# Patient Record
Sex: Female | Born: 1955
Health system: Southern US, Community
[De-identification: ages and names within clinical notes are randomized; demographics above are authoritative.]

## PROBLEM LIST (undated history)

## (undated) DIAGNOSIS — K602 Anal fissure, unspecified: Secondary | ICD-10-CM

## (undated) DIAGNOSIS — E119 Type 2 diabetes mellitus without complications: Secondary | ICD-10-CM

## (undated) DIAGNOSIS — K635 Polyp of colon: Secondary | ICD-10-CM

## (undated) DIAGNOSIS — E785 Hyperlipidemia, unspecified: Secondary | ICD-10-CM

## (undated) DIAGNOSIS — I10 Essential (primary) hypertension: Secondary | ICD-10-CM

## (undated) HISTORY — DX: Hyperlipidemia, unspecified: E78.5

## (undated) HISTORY — DX: Type 2 diabetes mellitus without complications: E11.9

## (undated) HISTORY — DX: Anal fissure, unspecified: K60.2

## (undated) HISTORY — PX: TONSILLECTOMY: SUR1361

## (undated) HISTORY — DX: Polyp of colon: K63.5

---

## 1979-02-22 HISTORY — PX: PELVIC LAPAROSCOPY: SHX162

## 1999-09-05 ENCOUNTER — Encounter: Admission: RE | Admit: 1999-09-05 | Discharge: 1999-09-05 | Payer: Self-pay | Admitting: Orthopedic Surgery

## 1999-09-05 ENCOUNTER — Encounter: Payer: Self-pay | Admitting: Orthopedic Surgery

## 1999-09-12 ENCOUNTER — Other Ambulatory Visit: Admission: RE | Admit: 1999-09-12 | Discharge: 1999-09-12 | Payer: Self-pay | Admitting: Obstetrics and Gynecology

## 2000-11-27 ENCOUNTER — Other Ambulatory Visit: Admission: RE | Admit: 2000-11-27 | Discharge: 2000-11-27 | Payer: Self-pay | Admitting: Obstetrics and Gynecology

## 2001-08-23 ENCOUNTER — Other Ambulatory Visit: Admission: RE | Admit: 2001-08-23 | Discharge: 2001-08-23 | Payer: Self-pay | Admitting: Obstetrics and Gynecology

## 2001-12-02 ENCOUNTER — Encounter: Payer: Self-pay | Admitting: Family Medicine

## 2001-12-02 ENCOUNTER — Encounter: Admission: RE | Admit: 2001-12-02 | Discharge: 2001-12-02 | Payer: Self-pay | Admitting: Family Medicine

## 2002-09-23 ENCOUNTER — Other Ambulatory Visit: Admission: RE | Admit: 2002-09-23 | Discharge: 2002-09-23 | Payer: Self-pay | Admitting: Obstetrics and Gynecology

## 2005-03-03 ENCOUNTER — Other Ambulatory Visit: Admission: RE | Admit: 2005-03-03 | Discharge: 2005-03-03 | Payer: Self-pay | Admitting: Obstetrics and Gynecology

## 2006-05-16 ENCOUNTER — Other Ambulatory Visit: Admission: RE | Admit: 2006-05-16 | Discharge: 2006-05-16 | Payer: Self-pay | Admitting: Obstetrics & Gynecology

## 2006-12-27 LAB — HM DEXA SCAN

## 2008-04-27 LAB — HM MAMMOGRAPHY: HM Mammogram: NEGATIVE

## 2008-05-11 ENCOUNTER — Other Ambulatory Visit: Admission: RE | Admit: 2008-05-11 | Discharge: 2008-05-11 | Payer: Self-pay | Admitting: Obstetrics and Gynecology

## 2010-04-25 LAB — HM COLONOSCOPY

## 2010-10-20 HISTORY — PX: ANAL FISSURE REPAIR: SHX2312

## 2011-10-24 LAB — HM PAP SMEAR: HM Pap smear: NEGATIVE

## 2012-10-23 ENCOUNTER — Other Ambulatory Visit: Payer: Self-pay | Admitting: Family

## 2012-10-23 ENCOUNTER — Ambulatory Visit
Admission: RE | Admit: 2012-10-23 | Discharge: 2012-10-23 | Disposition: A | Discharge status: Home / Self Care | Payer: PRIVATE HEALTH INSURANCE | Source: Ambulatory Visit | Attending: Family | Admitting: Family

## 2013-03-20 ENCOUNTER — Telehealth: Payer: Self-pay | Admitting: Nurse Practitioner

## 2013-03-20 NOTE — Telephone Encounter (Signed)
Patient calling she isn't sure when she should have her next annual since she is in menopause. Last exam was in October 24 2011 according to [facility].

## 2013-03-20 NOTE — Telephone Encounter (Signed)
LMTCB 03/20/13 cm

## 2013-03-21 NOTE — Telephone Encounter (Signed)
Booked AEX

## 2013-04-18 ENCOUNTER — Encounter: Payer: Self-pay | Admitting: Nurse Practitioner

## 2013-04-21 ENCOUNTER — Encounter: Payer: Self-pay | Admitting: Nurse Practitioner

## 2013-04-21 ENCOUNTER — Ambulatory Visit (INDEPENDENT_AMBULATORY_CARE_PROVIDER_SITE_OTHER): Payer: PRIVATE HEALTH INSURANCE | Admitting: Nurse Practitioner

## 2013-04-21 VITALS — BP 120/76 | HR 72 | Resp 16 | Ht 59.5 in | Wt 136.0 lb

## 2013-04-21 DIAGNOSIS — E559 Vitamin D deficiency, unspecified: Secondary | ICD-10-CM

## 2013-04-21 DIAGNOSIS — Z0000 Encounter for general adult medical examination without abnormal findings: Secondary | ICD-10-CM

## 2013-04-21 DIAGNOSIS — Z01419 Encounter for gynecological examination (general) (routine) without abnormal findings: Secondary | ICD-10-CM

## 2013-04-21 LAB — POCT URINALYSIS DIPSTICK
Bilirubin, UA: NEGATIVE
Blood, UA: NEGATIVE
Glucose, UA: NEGATIVE
Ketones, UA: NEGATIVE
Leukocytes, UA: NEGATIVE

## 2013-04-21 NOTE — Progress Notes (Signed)
Patient ID: Stephanie Stokes, female   DOB: 07-22-1955, 57 y.o.   MRN: [id number] 57 y.o. G2P2002 Married African American Fe here for annual exam.  Since daughter, Stephanie Stokes's suicide last year they have finally gotten rid of the car and her belongings. This is a new reality of her death.  Patient is still in counseling trying to deal with loss.  No LMP recorded. Patient is postmenopausal.          Sexually active: yes  The current method of family planning is post menopausal status.    Exercising: yes  Home exercise routine includes stretching and cardio at least 5 days per week. Smoker:  no  Health Maintenance: Pap: 10/24/11, WNL, neg HR HPV MMG: 04/27/08, Bi-Rads 1: negative Colonoscopy: 04/25/10, hyperplastic polyp, repeat 5 years BMD: 12/27/06, 2.9/0.9/0.5/1.3 TDaP: ?? Labs: PCP   reports that she has never smoked. She has never used smokeless tobacco. She reports that she does not drink alcohol or use illicit drugs.  Past Medical History  Diagnosis Date  . Anal fissure     Dr. Loreta Ave  . Colon polyp   . Diabetes mellitus without complication     pre-diabetes    Past Surgical History  Procedure Laterality Date  . Anal fissure repair  10/20/10    Dr. Loreta Ave    Current Outpatient Prescriptions  Medication Sig Dispense Refill  . ALPHA LIPOIC ACID PO Take by mouth.      Marland Kitchen aspirin EC 81 MG tablet Take 81 mg by mouth daily.      . citalopram (CELEXA) 20 MG tablet Take 20 mg by mouth daily.      . diazepam (VALIUM) 5 MG tablet Take 5 mg by mouth every 6 (six) hours as needed for anxiety.       No current facility-administered medications for this visit.    History reviewed. No pertinent family history.  ROS:  Pertinent items are noted in HPI.  Otherwise, a comprehensive ROS was negative.  Exam:   BP 120/76  Pulse 72  Resp 16  Ht 4' 11.5" (1.511 m)  Wt 136 lb (61.689 kg)  BMI 27.02 kg/m2 Height: 4' 11.5" (151.1 cm)  Ht Readings from Last 3 Encounters:  04/21/13 4' 11.5" (1.511  m)    General appearance: alert, cooperative and appears stated age Head: Normocephalic, without obvious abnormality, atraumatic Neck: no adenopathy, supple, symmetrical, trachea midline and thyroid normal to inspection and palpation Lungs: clear to auscultation bilaterally Breasts: normal appearance, no masses or tenderness Heart: regular rate and rhythm Abdomen: soft, non-tender; no masses,  no organomegaly Extremities: extremities normal, atraumatic, no cyanosis or edema Skin: Skin color, texture, turgor normal. No rashes or lesions Lymph nodes: Cervical, supraclavicular, and axillary nodes normal. No abnormal inguinal nodes palpated Neurologic: Grossly normal   Pelvic: External genitalia:  no lesions              Urethra:  normal appearing urethra with no masses, tenderness or lesions              Bartholin's and Skene's: normal                 Vagina: normal appearing vagina with normal color and discharge, no lesions              Cervix: anteverted              Pap taken: no Bimanual Exam:  Uterus:  normal size, contour, position, consistency, mobility, non-tender  Adnexa: no mass, fullness, tenderness               Rectovaginal: Confirms               Anus:  normal sphincter tone, no lesions  A:  Well Woman with normal exam  Postmenopausal  Grief reaction to daughters death  P:   Pap smear as per guidelines Not done this year  Mammogram due now and patient will call and schedule ( thinks it was done early last year)  Check Vit D and follow  Counseled on breast self exam, mammography screening, adequate intake of calcium and vitamin D, diet and exercise return annually or prn  An After Visit Summary was printed and given to the patient.

## 2013-04-21 NOTE — Patient Instructions (Signed)

## 2013-04-23 ENCOUNTER — Telehealth: Payer: Self-pay | Admitting: Nurse Practitioner

## 2013-04-23 NOTE — Telephone Encounter (Signed)
Notified of results

## 2013-04-23 NOTE — Telephone Encounter (Signed)
Chief Complaint  Patient presents with   Results  pt calling for vit d results.

## 2013-04-24 NOTE — Progress Notes (Signed)
Encounter reviewed by Dr. Brook Silva.  

## 2013-07-14 ENCOUNTER — Telehealth: Payer: Self-pay | Admitting: Nurse Practitioner

## 2013-07-14 NOTE — Telephone Encounter (Signed)
Stephanie Stokes from [facility] requests records with last vitamin D be faxed to their office please. Record faxed as requested.

## 2013-11-13 ENCOUNTER — Telehealth: Payer: Self-pay | Admitting: Nurse Practitioner

## 2013-11-13 NOTE — Telephone Encounter (Signed)
Spoke with patient. Patient states that she has been experiencing more vaginal dryness than ever before and would like to use something OTC for treatment. Advised patient could try Extra Virgin Olive Oil or Coconut oil. She can apply a small amount to vaginal area as needed for relief of vaginal dryness. Patient agreeable and states she will try this and give us a call back if she does not have relief and would like prescription.   Routing to provider for final review. Patient agreeable to disposition. Will close encounter.

## 2013-11-13 NOTE — Telephone Encounter (Signed)
Agree with recommendation

## 2013-11-13 NOTE — Telephone Encounter (Signed)
Pt wants to talk with the nurse no info given °

## 2014-05-04 ENCOUNTER — Encounter: Payer: Self-pay | Admitting: Nurse Practitioner

## 2014-05-18 ENCOUNTER — Telehealth: Payer: Self-pay | Admitting: Nurse Practitioner

## 2014-05-18 NOTE — Telephone Encounter (Signed)
Spoke with patient states at last visit PG discussed a herbal supplement that will help with sleep. Please advise. She has been on citalopram 20 mg daily and valium 5mg  1/2 tablet in am and 1/2 tablet in the pm. States is going to ask PCP if she can titrate off the citalopram after the first of the year and has not used any valium since 05/15/14, had titrated to just PRN.

## 2014-05-18 NOTE — Telephone Encounter (Signed)
Melatonin OTC will help her sleep cycles return to normal.

## 2014-05-18 NOTE — Telephone Encounter (Signed)
Patient calling to request name of over the counter medicine to "help with sleep during menopause."

## 2014-05-19 NOTE — Telephone Encounter (Signed)
Patient notified. Aware to call prn.//kn

## 2014-09-22 ENCOUNTER — Telehealth: Payer: Self-pay | Admitting: Nurse Practitioner

## 2014-09-22 NOTE — Telephone Encounter (Signed)
Patient had called at lunch and left a message with no details. I am returning her call to see how we can be of service today.

## 2014-09-22 NOTE — Telephone Encounter (Signed)
Spoke with patient. She states PCP has her on Metformin. Patient unsure of dose or how often taking. Patient is not sure when last HgbA1c was completed or what the level was. Patient does not check fingerstick glucose. PCP is Dr. Allyne GeeSanders per patient. Patient states she spoke with PCP and PCP advised that if increasing carbohydrates, A1C would increase.  Patient states she called pharmacist and pharmacist suggested increasing protein while training and increase carbohydrate day of race only.   Patient would like to know if Stephanie FranklinPatricia Rolen-Grubb, FNP has any recommendations for her and diet while training for 5k.  Patient states she has been exercising (walking) consistently at least 30 minutes per day. Advised patient would send message to Stephanie FranklinPatricia Rolen-Grubb, FNP and return her call.

## 2014-09-22 NOTE — Telephone Encounter (Addendum)
Patient is calling to get advice from PG. She states she has type 2 Diabetes that is controlled by using Metformin and she is going to be in the [employer] in May. She is checking in with her doctors to see what recommendation to do until the race.

## 2014-10-08 NOTE — Telephone Encounter (Signed)
Patty can you review and advise.

## 2014-10-12 NOTE — Telephone Encounter (Signed)
Please refer those questions to her PCP as the amount of carb for training will depend on her basic control status.

## 2014-10-14 NOTE — Telephone Encounter (Signed)
Spoke with patient and message from Lauro FranklinPatricia Rolen-Grubb, FNP given. Patient agreeable. Will follow up with pcp.

## 2014-12-23 ENCOUNTER — Telehealth: Payer: Self-pay | Admitting: Nurse Practitioner

## 2014-12-23 NOTE — Telephone Encounter (Signed)
Call to patient. 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 Advised patient that I do not believe that our office needs anyone with that experience because we are a gyn office and not having end of life discussions with patients that would be billed to medicare. Patient requests to speak specifically with Print production planner. Advised her I would send her information to office manager.  Will close encounter.

## 2014-12-23 NOTE — Telephone Encounter (Signed)
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

## 2016-01-22 ENCOUNTER — Ambulatory Visit (HOSPITAL_COMMUNITY)
Admission: EM | Admit: 2016-01-22 | Discharge: 2016-01-22 | Disposition: A | Discharge status: Home / Self Care | Payer: BC Managed Care – PPO | Attending: Family Medicine | Admitting: Family Medicine

## 2016-01-22 ENCOUNTER — Encounter (HOSPITAL_COMMUNITY): Payer: Self-pay | Admitting: *Deleted

## 2016-01-22 DIAGNOSIS — F32A Depression, unspecified: Secondary | ICD-10-CM

## 2016-01-22 DIAGNOSIS — F418 Other specified anxiety disorders: Secondary | ICD-10-CM | POA: Diagnosis not present

## 2016-01-22 DIAGNOSIS — F419 Anxiety disorder, unspecified: Principal | ICD-10-CM

## 2016-01-22 DIAGNOSIS — F329 Major depressive disorder, single episode, unspecified: Secondary | ICD-10-CM

## 2016-01-22 LAB — POCT I-STAT, CHEM 8
BUN: 15 mg/dL (ref 6–20)
Calcium, Ion: 1.16 mmol/L (ref 1.12–1.23)
Chloride: 103 mmol/L (ref 101–111)
Creatinine, Ser: 1 mg/dL (ref 0.44–1.00)
GLUCOSE: 104 mg/dL — AB (ref 65–99)
HCT: 38 % (ref 36.0–46.0)
HEMOGLOBIN: 12.9 g/dL (ref 12.0–15.0)
POTASSIUM: 4 mmol/L (ref 3.5–5.1)
SODIUM: 139 mmol/L (ref 135–145)
TCO2: 26 mmol/L (ref 0–100)

## 2016-01-22 NOTE — ED Provider Notes (Signed)
CSN: [id number]     Arrival date & time 01/22/16  1830 History   First MD Initiated Contact with Patient 01/22/16 1901     Chief Complaint  Patient presents with  . Numbness   (Consider location/radiation/quality/duration/timing/severity/associated sxs/prior Treatment) Patient is a 60 y.o. female presenting with mental health disorder.  Mental Health Problem Presenting symptoms: depression   Presenting symptoms comment:  Daughter committed suicide sev yrs ago on aug 15 which was her birthday. 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 Degree of incapacity (severity):  Moderate Onset quality:  Gradual Progression:  Worsening Chronicity:  Chronic   Past Medical History  Diagnosis Date  . Anal fissure     Dr. Loreta Ave  . Colon polyp   . Diabetes mellitus without complication (HCC)     diet and exercise controlled   Past Surgical History  Procedure Laterality Date  . Anal fissure repair  10/20/10    Dr. Loreta Ave  . Tonsillectomy  age 72  . Cesarean section   02/15/79 & 03/31/83  . Pelvic laparoscopy  02/22/79    lysis of adhesions   Family History  Problem Relation Age of Onset  . Diabetes Sister   . Breast cancer Maternal Aunt   . Cancer Maternal Grandfather   . Heart failure Paternal Grandfather   . Osteoarthritis Sister    Social History  Substance Use Topics  . Smoking status: Never Smoker   . Smokeless tobacco: Never Used  . Alcohol Use: No   OB History    Gravida Para Term Preterm AB TAB SAB Ectopic Multiple Living   2 2 2       2      Review of Systems  Constitutional: Negative.   Psychiatric/Behavioral: Positive for dysphoric mood.  All other systems reviewed and are negative.   Allergies  Review of patient's allergies indicates no known allergies.  Home Medications   Prior to Admission medications   Medication Sig Start Date End Date Taking? Authorizing Provider  aspirin EC 81 MG tablet Take 81 mg  by mouth daily.   Yes Historical Provider, MD  citalopram (CELEXA) 20 MG tablet Take 20 mg by mouth daily.   Yes Historical Provider, MD  ALPHA LIPOIC ACID PO Take by mouth.    Historical Provider, MD  cholecalciferol (VITAMIN D) 1000 UNITS tablet Take 1,000 Units by mouth daily.    Historical Provider, MD  diazepam (VALIUM) 5 MG tablet Take 5 mg by mouth every 6 (six) hours as needed for anxiety.    Historical Provider, MD   Meds Ordered and Administered this Visit  Medications - No data to display  BP 136/94 mmHg  Pulse 89  Temp(Src) 98.3 F (36.8 C) (Oral)  Resp 20  SpO2 97% No data found.   Physical Exam  Constitutional: She is oriented to person, place, and time. She appears well-developed and well-nourished. She appears distressed.  Neurological: She is alert and oriented to person, place, and time.  Skin: Skin is warm and dry.  Psychiatric: Her speech is normal and behavior is normal. Judgment and thought content normal. Her affect is labile. Cognition and memory are normal.  Pt emotional, crying, fully alert and communicative.  Nursing note and vitals reviewed.   ED Course  Procedures (including critical care time)  Labs Review Labs Reviewed  POCT I-STAT, CHEM 8 - Abnormal; Notable for the following:    Glucose, Bld 104 (*)  All other components within normal limits   i-stat wnl.  Imaging Review No results found.   Visual Acuity Review  Right Eye Distance:   Left Eye Distance:   Bilateral Distance:    Right Eye Near:   Left Eye Near:    Bilateral Near:         MDM   1. Anxiety and depression        Linna Hoff, MD 01/22/16 2002

## 2016-01-22 NOTE — ED Notes (Signed)
C/O intermittent numbness in LUE down into LLE - denies at present.  States BP has been elevated over past month, c/o intermittent HAs, dry mouth.  Pt very tearful, intermittently cyring, describing upcoming anniversary of daughter's suicide and recent return to a full-time job.

## 2016-01-22 NOTE — Discharge Instructions (Signed)
Use your valium as needed. See your therapist on mon as planned.

## 2016-08-24 ENCOUNTER — Ambulatory Visit (HOSPITAL_COMMUNITY)
Admission: EM | Admit: 2016-08-24 | Discharge: 2016-08-24 | Disposition: A | Discharge status: Home / Self Care | Payer: BC Managed Care – PPO | Attending: Family Medicine | Admitting: Family Medicine

## 2016-08-24 ENCOUNTER — Encounter (HOSPITAL_COMMUNITY): Payer: Self-pay | Admitting: *Deleted

## 2016-08-24 DIAGNOSIS — J040 Acute laryngitis: Secondary | ICD-10-CM

## 2016-08-24 DIAGNOSIS — J069 Acute upper respiratory infection, unspecified: Secondary | ICD-10-CM | POA: Diagnosis not present

## 2016-08-24 DIAGNOSIS — B9789 Other viral agents as the cause of diseases classified elsewhere: Secondary | ICD-10-CM | POA: Diagnosis not present

## 2016-08-24 MED ORDER — MAGIC MOUTHWASH W/LIDOCAINE: 5.0000 mL | Freq: Three times a day (TID) | ORAL | 0 refills | Status: DC | PRN

## 2016-08-24 MED ORDER — BENZONATATE 100 MG PO CAPS: 100.0000 mg | ORAL_CAPSULE | Freq: Three times a day (TID) | ORAL | 0 refills | Status: DC

## 2016-08-24 MED ORDER — PREDNISONE 20 MG PO TABS: 20.0000 mg | ORAL_TABLET | Freq: Two times a day (BID) | ORAL | 0 refills | Status: AC

## 2016-08-24 NOTE — ED Triage Notes (Signed)
Pt     Reports   Of   Cough        sorethroat          Hoarse          X   sev  Days  Also    Reports      Low    abd  Pain pain  Both  Lower  Sides

## 2016-08-24 NOTE — Discharge Instructions (Signed)
You have a viral URI with cough, and acute laryngitis. This type of infection does not respond to antibiotics. For your symptoms I prescribed 3 medications. For cough, I have prescribed a medication called Tessalon. Take 1 tablet every 8 hours as needed for your cough. For swelling and inflammation, prednisone, take one tablet twice a day with food, for 6 days. For your sore throat, I have prescribed Magic mouthwash, take 5 mL swish and swallow 3 times a day as needed for pain.  I advise complete voice rest. Should your symptoms fail to improve, or if at any time if they worsen, follow up with her primary care provider or return to clinic.

## 2016-08-24 NOTE — ED Provider Notes (Signed)
CSN: [id number]     Arrival date & time 08/24/16  1001 History   None    Chief Complaint  Patient presents with  . Sore Throat   (Consider location/radiation/quality/duration/timing/severity/associated sxs/prior Treatment) 61 year old female arrives to clinic with chief complaint of URI like symptoms. Her symptoms are 3 days in length. She denies fever, muscle aches, bodyaches, or nausea, vomiting, or diarrhea. Her cough is described as dry, hacking, nonproductive, with clear sputum. She further describes hoarseness of her voice, and sore throat. She denies history of fever.   The history is provided by the patient.    Past Medical History:  Diagnosis Date  . Anal fissure    Dr. Loreta AveMann  . Colon polyp   . Diabetes mellitus without complication (HCC)    diet and exercise controlled   Past Surgical History:  Procedure Laterality Date  . ANAL FISSURE REPAIR  10/20/10   Dr. Loreta AveMann  . CESAREAN SECTION   02/15/79 & 03/31/83  . PELVIC LAPAROSCOPY  02/22/79   lysis of adhesions  . TONSILLECTOMY  age 89   Family History  Problem Relation Age of Onset  . Diabetes Sister   . Osteoarthritis Sister   . Breast cancer Maternal Aunt   . Cancer Maternal Grandfather   . Heart failure Paternal Grandfather    Social History  Substance Use Topics  . Smoking status: Never Smoker  . Smokeless tobacco: Never Used  . Alcohol use No   OB History    Gravida Para Term Preterm AB Living   2 2 2     2    SAB TAB Ectopic Multiple Live Births                 Review of Systems  Reason unable to perform ROS: as covered in HPI.  All other systems reviewed and are negative.   Allergies  Patient has no known allergies.  Home Medications   Prior to Admission medications   Medication Sig Start Date End Date Taking? Authorizing Provider  ALPHA LIPOIC ACID PO Take by mouth.    Historical Provider, MD  aspirin EC 81 MG tablet Take 81 mg by mouth daily.    Historical Provider, MD  benzonatate (TESSALON)  100 MG capsule Take 1 capsule (100 mg total) by mouth every 8 (eight) hours. 08/24/16   Dorena BodoLawrence Domnique Vanegas, NP  cholecalciferol (VITAMIN D) 1000 UNITS tablet Take 1,000 Units by mouth daily.    Historical Provider, MD  citalopram (CELEXA) 20 MG tablet Take 20 mg by mouth daily.    Historical Provider, MD  diazepam (VALIUM) 5 MG tablet Take 5 mg by mouth every 6 (six) hours as needed for anxiety.    Historical Provider, MD  magic mouthwash w/lidocaine SOLN Take 5 mLs by mouth 3 (three) times daily as needed for mouth pain. 08/24/16   Dorena BodoLawrence Yavuz Kirby, NP  predniSONE (DELTASONE) 20 MG tablet Take 1 tablet (20 mg total) by mouth 2 (two) times daily with a meal. 08/24/16 08/30/16  Dorena BodoLawrence Justene Jensen, NP   Meds Ordered and Administered this Visit  Medications - No data to display  BP 138/80 (BP Location: Right Arm)   Pulse 80   Temp 98.6 F (37 C) (Oral)   Resp 16   SpO2 99%  No data found.   Physical Exam  Constitutional: She is oriented to person, place, and time. She appears well-developed and well-nourished. She does not have a sickly appearance. She does not appear ill. No distress.  HENT:  Head:  Normocephalic and atraumatic.  Right Ear: Tympanic membrane and external ear normal.  Left Ear: Tympanic membrane and external ear normal.  Nose: Nose normal. Right sinus exhibits no maxillary sinus tenderness and no frontal sinus tenderness. Left sinus exhibits no maxillary sinus tenderness and no frontal sinus tenderness.  Mouth/Throat: Uvula is midline and mucous membranes are normal. Posterior oropharyngeal erythema present. No oropharyngeal exudate or posterior oropharyngeal edema. Tonsils are 0 on the right. Tonsils are 0 on the left. No tonsillar exudate.  Eyes: Pupils are equal, round, and reactive to light.  Neck: Normal range of motion. Neck supple. No JVD present.  Cardiovascular: Normal rate and regular rhythm.   Pulmonary/Chest: Effort normal and breath sounds normal. No respiratory  distress. She has no wheezes.  Abdominal: Soft. Bowel sounds are normal. She exhibits no distension. There is no tenderness. There is no guarding.  Lymphadenopathy:       Head (right side): No submental, no submandibular, no tonsillar and no preauricular adenopathy present.       Head (left side): No submental, no submandibular, no tonsillar and no preauricular adenopathy present.    She has no cervical adenopathy.  Neurological: She is alert and oriented to person, place, and time.  Skin: Skin is warm and dry. Capillary refill takes less than 2 seconds. She is not diaphoretic.  Psychiatric: She has a normal mood and affect.  Nursing note and vitals reviewed.   Urgent Care Course     Procedures (including critical care time)  Labs Review Labs Reviewed - No data to display  Imaging Review No results found.   Visual Acuity Review  Right Eye Distance:   Left Eye Distance:   Bilateral Distance:    Right Eye Near:   Left Eye Near:    Bilateral Near:         MDM   1. Laryngitis, acute   2. Viral URI with cough    You have a viral URI with cough, and acute laryngitis. This type of infection does not respond to antibiotics. For your symptoms I prescribed 3 medications. For cough, I have prescribed a medication called Tessalon. Take 1 tablet every 8 hours as needed for your cough. For swelling and inflammation, prednisone, take one tablet twice a day with food, for 6 days. For your sore throat, I have prescribed Magic mouthwash, take 5 mL swish and swallow 3 times a day as needed for pain.  I advise complete voice rest. Should your symptoms fail to improve, or if at any time if they worsen, follow up with her primary care provider or return to clinic.      Dorena Bodo, NP 08/24/16 1124

## 2018-01-04 DIAGNOSIS — Z136 Encounter for screening for cardiovascular disorders: Secondary | ICD-10-CM | POA: Diagnosis not present

## 2018-01-04 DIAGNOSIS — E785 Hyperlipidemia, unspecified: Secondary | ICD-10-CM | POA: Diagnosis not present

## 2018-01-22 DIAGNOSIS — D72819 Decreased white blood cell count, unspecified: Secondary | ICD-10-CM | POA: Diagnosis not present

## 2018-02-08 DIAGNOSIS — L02229 Furuncle of trunk, unspecified: Secondary | ICD-10-CM | POA: Diagnosis not present

## 2018-04-22 DIAGNOSIS — J309 Allergic rhinitis, unspecified: Secondary | ICD-10-CM | POA: Diagnosis not present

## 2018-04-22 DIAGNOSIS — R202 Paresthesia of skin: Secondary | ICD-10-CM | POA: Diagnosis not present

## 2018-04-22 DIAGNOSIS — K219 Gastro-esophageal reflux disease without esophagitis: Secondary | ICD-10-CM | POA: Diagnosis not present

## 2018-05-13 ENCOUNTER — Other Ambulatory Visit: Payer: Self-pay

## 2018-05-13 MED ORDER — SERTRALINE HCL 100 MG PO TABS: 200.0000 mg | ORAL_TABLET | Freq: Every day | ORAL | 1 refills | Status: DC

## 2018-05-21 ENCOUNTER — Other Ambulatory Visit (HOSPITAL_COMMUNITY)
Admission: RE | Admit: 2018-05-21 | Discharge: 2018-05-21 | Disposition: A | Discharge status: Home / Self Care | Payer: 59 | Source: Ambulatory Visit | Attending: Physician Assistant | Admitting: Physician Assistant

## 2018-05-21 ENCOUNTER — Other Ambulatory Visit: Payer: Self-pay | Admitting: Physician Assistant

## 2018-05-21 DIAGNOSIS — E119 Type 2 diabetes mellitus without complications: Secondary | ICD-10-CM | POA: Diagnosis not present

## 2018-05-21 DIAGNOSIS — Z0000 Encounter for general adult medical examination without abnormal findings: Secondary | ICD-10-CM | POA: Insufficient documentation

## 2018-05-21 DIAGNOSIS — E785 Hyperlipidemia, unspecified: Secondary | ICD-10-CM | POA: Diagnosis not present

## 2018-05-22 LAB — CYTOLOGY - PAP
DIAGNOSIS: NEGATIVE
HPV: NOT DETECTED

## 2018-07-09 ENCOUNTER — Other Ambulatory Visit: Payer: Self-pay

## 2018-07-09 ENCOUNTER — Telehealth: Payer: Self-pay | Admitting: Psychiatry

## 2018-07-09 MED ORDER — SERTRALINE HCL 100 MG PO TABS: 200.0000 mg | ORAL_TABLET | Freq: Every day | ORAL | 0 refills | Status: DC

## 2018-07-09 NOTE — Telephone Encounter (Signed)
rx sent to [facility] on [address]

## 2018-07-09 NOTE — Telephone Encounter (Signed)
Pt. Called and ssaid that shee is completely out of her sertraline. [facility] is sending a refill but it will not get to her until jan 11th. Can you please a weeks worth to the [facility] on [address]

## 2018-07-19 ENCOUNTER — Encounter: Payer: Self-pay | Admitting: Emergency Medicine

## 2018-07-19 DIAGNOSIS — F4310 Post-traumatic stress disorder, unspecified: Secondary | ICD-10-CM

## 2018-07-19 DIAGNOSIS — F411 Generalized anxiety disorder: Secondary | ICD-10-CM | POA: Insufficient documentation

## 2018-08-08 ENCOUNTER — Telehealth: Payer: Self-pay | Admitting: Psychiatry

## 2018-08-08 NOTE — Telephone Encounter (Signed)
Need to review paper chart  

## 2018-08-08 NOTE — Telephone Encounter (Signed)
Stephanie Stokes called to report that she is having difficulties sleeping.  She wants to discuss how she take trazodone.  Please call.  Next appt 09/09/18 - [phone]

## 2018-08-09 NOTE — Telephone Encounter (Signed)
Left voice mail to call back 

## 2018-08-12 ENCOUNTER — Other Ambulatory Visit: Payer: Self-pay | Admitting: Psychiatry

## 2018-08-12 MED ORDER — DOXAZOSIN MESYLATE 2 MG PO TABS: ORAL_TABLET | ORAL | 1 refills | Status: DC

## 2018-08-12 NOTE — Telephone Encounter (Signed)
It is difficult to treat disturbing dreams with anything except prazosin and doxazosin which are BP meds.  If I give her a low dose it may help.  The only way it will prevent them is to take one of these nightly for a while.  Doxazosin is better tolerated.    I'll send in Rx for doxazosin 2 mg 1 at night for a week and if needed then 2 tablets at night.  Meredith Staggers, MD, DFAPA

## 2018-08-12 NOTE — Telephone Encounter (Signed)
Pt is complaining of having vivid dreams at least once a week but they feel real. She wakes up exhausted, she's sleeping but it feels like she has a "night life",such as planning events with her deceased daughter. Not sure how long this has been occurring, not happening at her last office visit. Sees Dr. Ave Filter for counseling. Takes trazodone but not every night. Any suggestions? Next OV 09/09/2018  Paper chart at desk

## 2018-08-12 NOTE — Progress Notes (Signed)
Doxazosin prescription 2 mg sent to [facility] 1-2 nightly for nightmares

## 2018-08-13 NOTE — Telephone Encounter (Signed)
Left voice mail to call back 

## 2018-08-13 NOTE — Telephone Encounter (Signed)
Pt given information and verbalized understanding, instructed to call back with any questions or concerns.

## 2018-09-09 ENCOUNTER — Telehealth: Payer: Self-pay | Admitting: Psychiatry

## 2018-09-09 ENCOUNTER — Ambulatory Visit: Payer: 59 | Admitting: Psychiatry

## 2018-09-09 ENCOUNTER — Encounter: Payer: Self-pay | Admitting: Psychiatry

## 2018-09-09 DIAGNOSIS — F331 Major depressive disorder, recurrent, moderate: Secondary | ICD-10-CM | POA: Diagnosis not present

## 2018-09-09 DIAGNOSIS — F5105 Insomnia due to other mental disorder: Secondary | ICD-10-CM

## 2018-09-09 DIAGNOSIS — F411 Generalized anxiety disorder: Secondary | ICD-10-CM

## 2018-09-09 DIAGNOSIS — F4310 Post-traumatic stress disorder, unspecified: Secondary | ICD-10-CM

## 2018-09-09 NOTE — Telephone Encounter (Signed)
Patient called and said that after she left your office she had a terrible reaction to something they put on her hair at the salon. She went back to the salon and they cut off her hair but it is itching. What do you recommend she can use to help with her head itching?
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
## 2018-09-09 NOTE — Telephone Encounter (Signed)
I am obviously not an expert on skin.  If she has any thing other than mild redness and itching on her skin she should see a physician her primary care doctor about it and see if more aggressive treatments should be pursued.  If it is mild redness and not burned I would be happy to prescribe hydroxyzine 10 mg tablets 1-2 3 times daily as needed itching for 5 days.  Number 30 tablets no refills.  If her scalp gets worse tomorrow than it is today then she needs to see a doctor about it.  Please send in the prescription  Meredith Staggers, MD, DFAPA

## 2018-09-09 NOTE — Telephone Encounter (Signed)
Left voice mail

## 2018-09-10 NOTE — Telephone Encounter (Signed)
Pt called back and stated it's taken care of she had her hair shaved and doesn't need anything for itching.

## 2018-09-10 NOTE — Telephone Encounter (Signed)
Left voice mail

## 2018-10-17 ENCOUNTER — Other Ambulatory Visit: Payer: Self-pay

## 2018-10-17 MED ORDER — TRAZODONE HCL 50 MG PO TABS: 50.0000 mg | ORAL_TABLET | Freq: Every evening | ORAL | 2 refills | Status: DC | PRN

## 2018-12-05 ENCOUNTER — Telehealth: Payer: Self-pay | Admitting: Psychiatry

## 2018-12-05 NOTE — Telephone Encounter (Signed)
Stephanie Stokes is a past patient of yours.  Last seen by 02/2015.  Asked that you call her. I believe about re-starting sessions with you.

## 2018-12-22 ENCOUNTER — Other Ambulatory Visit: Payer: Self-pay | Admitting: Psychiatry

## 2019-03-07 ENCOUNTER — Other Ambulatory Visit: Payer: Self-pay | Admitting: Psychiatry

## 2019-03-13 ENCOUNTER — Ambulatory Visit: Payer: 59 | Admitting: Psychiatry

## 2019-03-31 ENCOUNTER — Telehealth: Payer: Self-pay | Admitting: Psychiatry

## 2019-03-31 NOTE — Telephone Encounter (Signed)
Patient called and would like to talk to the nurse about chemical imbalance . She heard a counselor say that that was not real.Please give her a call at [phone] when you have a chance

## 2019-04-03 NOTE — Telephone Encounter (Signed)
Left her a VM to return my call.

## 2019-04-15 ENCOUNTER — Other Ambulatory Visit: Payer: Self-pay

## 2019-04-15 ENCOUNTER — Ambulatory Visit (INDEPENDENT_AMBULATORY_CARE_PROVIDER_SITE_OTHER): Payer: 59 | Admitting: Psychiatry

## 2019-04-15 ENCOUNTER — Encounter: Payer: Self-pay | Admitting: Psychiatry

## 2019-04-15 DIAGNOSIS — F4310 Post-traumatic stress disorder, unspecified: Secondary | ICD-10-CM

## 2019-04-15 DIAGNOSIS — F411 Generalized anxiety disorder: Secondary | ICD-10-CM

## 2019-04-15 DIAGNOSIS — F331 Major depressive disorder, recurrent, moderate: Secondary | ICD-10-CM

## 2019-04-15 DIAGNOSIS — F5105 Insomnia due to other mental disorder: Secondary | ICD-10-CM | POA: Diagnosis not present

## 2019-04-15 MED ORDER — SERTRALINE HCL 100 MG PO TABS: 200.0000 mg | ORAL_TABLET | Freq: Every day | ORAL | 3 refills | Status: DC
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
## 2019-04-16 NOTE — Telephone Encounter (Signed)
Pt. Had an appt. Yesterday. I am going to close out this message.

## 2019-04-28 ENCOUNTER — Ambulatory Visit: Payer: 59 | Admitting: Psychiatry

## 2019-06-28 ENCOUNTER — Other Ambulatory Visit: Payer: Self-pay | Admitting: Psychiatry

## 2019-06-30 NOTE — Telephone Encounter (Signed)
Can you clarify with her on pharmacy submitted year supply to [place]

## 2019-06-30 NOTE — Telephone Encounter (Signed)
Left her a VM to return my call.

## 2019-08-01 ENCOUNTER — Other Ambulatory Visit: Payer: Self-pay | Admitting: Family Medicine

## 2019-08-01 DIAGNOSIS — Z1231 Encounter for screening mammogram for malignant neoplasm of breast: Secondary | ICD-10-CM

## 2019-09-04 ENCOUNTER — Telehealth: Payer: Self-pay | Admitting: Psychiatry

## 2019-09-04 NOTE — Telephone Encounter (Signed)
Put her on cancellation list. °

## 2019-09-04 NOTE — Telephone Encounter (Signed)
Ask her what sort of symptoms she is having.  Is it more anxiety or more fatigue and depression?

## 2019-09-04 NOTE — Telephone Encounter (Signed)
Patient called and said that she is not doing well. She is stressed more with work and isolation. She said that she is tired. Please call her with a suggestions of whether she needs to increase any medications or change medications Please give her a call at [phone]

## 2019-09-05 NOTE — Telephone Encounter (Signed)
Left patient message to call back with her symptoms

## 2019-09-05 NOTE — Telephone Encounter (Signed)
I cannot really adequately address this over the phone.  She needs an appointment.  Put her on the cancellation list.

## 2019-09-05 NOTE — Telephone Encounter (Signed)
Stephanie Stokes called back and reported that she is experiencing more agitation, more depression and still has some grief from her fathers death in February 22, 2023. Mostly she feels it's COVID depression.  She is working from home and is very isolated and she is a "people" person and needs that interaction. Just feels she's getting to a breaking point.  This COVID is "kicking her butt"

## 2019-09-09 NOTE — Telephone Encounter (Signed)
Pt. Made aware and verbalized understanding.

## 2019-09-25 ENCOUNTER — Ambulatory Visit: Payer: 59 | Admitting: Psychiatry

## 2019-10-16 ENCOUNTER — Telehealth: Payer: Self-pay | Admitting: Psychiatry

## 2019-10-16 NOTE — Telephone Encounter (Signed)
Regular brain scans such as MRIs and CT scans are not useful for the purpose she is requesting.  The only brain scans that might be relevant in this regard are used in research only.  They are not available to the public nor to me. I do not know if she had something special in mind?

## 2019-10-16 NOTE — Telephone Encounter (Signed)
Stephanie Stokes wants to know if you can order a brain scan.  She said she has a family history of mental illness and wants the scan to show if she is prone to mental illness so she has it a documentation for family history.  Please call to discuss.

## 2019-10-16 NOTE — Telephone Encounter (Signed)
Left message with patient that a regular MRI or CT would not detect anything she's referring to. If she is aware of a certain test call back and let Dr. Jennelle Human know.

## 2019-10-23 ENCOUNTER — Ambulatory Visit: Payer: 59 | Admitting: Psychiatry

## 2020-03-18 ENCOUNTER — Ambulatory Visit: Payer: 59 | Admitting: Obstetrics and Gynecology

## 2020-03-18 ENCOUNTER — Other Ambulatory Visit: Payer: Self-pay

## 2020-03-18 ENCOUNTER — Encounter: Payer: Self-pay | Admitting: Obstetrics and Gynecology

## 2020-03-18 VITALS — BP 122/64 | HR 94 | Ht 59.0 in | Wt 131.0 lb

## 2020-03-18 DIAGNOSIS — N907 Vulvar cyst: Secondary | ICD-10-CM | POA: Diagnosis not present

## 2020-03-18 NOTE — Progress Notes (Signed)
64 y.o. G30P2001 Married Other or two or more races Not Hispanic or Latino female here for a bump on her labia. She says that there is no soreness or itching. She has been using warm compresses. The only time she is aware of them is when she showers and washes.   She has a lump between her right thigh and labia and a lump on her left labia. It doesn't bother her, she notices it in the shower. The lumps haven't gotten bigger or changed. She showed them to her primary, who felt they were benign but recommended she see GYN.     No LMP recorded. Patient is postmenopausal.          Sexually active: No.  The current method of family planning is post menopausal status.    Exercising: Yes.    Walking and light weights  Smoker:  no  Health Maintenance: Pap:  05/21/2018 WNL HR HPV Neg  History of abnormal Pap:  no MMG:  Patient unsure of most recent.  BMD:   Patient unsure when done a soils  Colonoscopy:  4 years ago normal  TDaP:  Up to date  Gardasil: NA   reports that she has never smoked. She has never used smokeless tobacco. She reports that she does not drink alcohol and does not use drugs.  Past Medical History:  Diagnosis Date  . Anal fissure    Dr. Loreta Ave  . Colon polyp   . Diabetes mellitus without complication (HCC)    diet and exercise controlled    Past Surgical History:  Procedure Laterality Date  . ANAL FISSURE REPAIR  10/20/10   Dr. Loreta Ave  . CESAREAN SECTION   02/15/79 & 03/31/83  . PELVIC LAPAROSCOPY  02/22/79   lysis of adhesions  . TONSILLECTOMY  age 36    Current Outpatient Medications  Medication Sig Dispense Refill  . aspirin EC 81 MG tablet Take 81 mg by mouth daily.    Marland Kitchen atorvastatin (LIPITOR) 20 MG tablet Take 20 mg by mouth daily.    . Lancets (ONETOUCH DELICA PLUS LANCET33G) MISC Apply 1 each topically daily.    Marland Kitchen lisinopril (ZESTRIL) 2.5 MG tablet Take 2.5 mg by mouth daily.    . metFORMIN (GLUCOPHAGE-XR) 500 MG 24 hr tablet Take 1,000 mg by mouth daily. After  a meal    . ONETOUCH VERIO test strip 1 each daily.     No current facility-administered medications for this visit.    Family History  Problem Relation Age of Onset  . Diabetes Sister   . Osteoarthritis Sister   . Breast cancer Maternal Aunt   . Cancer Maternal Grandfather   . Heart failure Paternal Grandfather     Review of Systems  Genitourinary: Negative for genital sores.  All other systems reviewed and are negative.   Exam:   BP 122/64   Pulse 94   Ht 4\' 11"  (1.499 m)   Wt 131 lb (59.4 kg)   SpO2 98%   BMI 26.46 kg/m   Weight change: @WEIGHTCHANGE @ Height:   Height: 4\' 11"  (149.9 cm)  Ht Readings from Last 3 Encounters:  03/18/20 4\' 11"  (1.499 m)  04/21/13 4' 11.5" (1.511 m)    General appearance: alert, cooperative and appears stated age   Pelvic: External genitalia:  She has a mobile, smooth subdermal cyst on the lateral right labia majora, <1 cm and a 5-6 mm smooth skin colored cyst on the left labia majora.  Urethra:  normal appearing urethra with no masses, tenderness or lesions              Bartholins and Skenes: normal                  Carolynn Serve chaperoned for the exam.  A:  Bilateral vulvar epidermal cysts. Benign, no f/u needed  P:   Patient reassured.

## 2020-06-29 ENCOUNTER — Ambulatory Visit
Admission: RE | Admit: 2020-06-29 | Discharge: 2020-06-29 | Disposition: A | Discharge status: Home / Self Care | Payer: 59 | Source: Ambulatory Visit | Attending: Family Medicine | Admitting: Family Medicine

## 2020-06-29 ENCOUNTER — Other Ambulatory Visit: Payer: Self-pay

## 2020-06-29 ENCOUNTER — Other Ambulatory Visit: Payer: Self-pay | Admitting: Family Medicine

## 2020-06-29 DIAGNOSIS — M545 Low back pain, unspecified: Secondary | ICD-10-CM

## 2020-06-30 ENCOUNTER — Other Ambulatory Visit: Payer: Self-pay | Admitting: Psychiatry

## 2020-07-07 ENCOUNTER — Ambulatory Visit: Payer: Self-pay | Admitting: Psychiatry

## 2020-07-25 ENCOUNTER — Ambulatory Visit
Admission: EM | Admit: 2020-07-25 | Discharge: 2020-07-25 | Disposition: A | Discharge status: Home / Self Care | Payer: 59

## 2020-07-25 ENCOUNTER — Emergency Department (HOSPITAL_COMMUNITY): Payer: 59 | Admitting: Certified Registered Nurse Anesthetist

## 2020-07-25 ENCOUNTER — Inpatient Hospital Stay (HOSPITAL_COMMUNITY)
Admission: EM | Admit: 2020-07-25 | Discharge: 2020-07-28 | DRG: 603 | Disposition: A | Discharge status: Home Health | Payer: 59 | Source: Ambulatory Visit | Attending: Surgery | Admitting: Surgery

## 2020-07-25 ENCOUNTER — Emergency Department (HOSPITAL_COMMUNITY): Payer: 59

## 2020-07-25 ENCOUNTER — Encounter: Payer: Self-pay | Admitting: Emergency Medicine

## 2020-07-25 ENCOUNTER — Encounter (HOSPITAL_COMMUNITY): Payer: Self-pay | Admitting: Certified Registered Nurse Anesthetist

## 2020-07-25 ENCOUNTER — Encounter (HOSPITAL_COMMUNITY): Admission: EM | Disposition: A | Discharge status: Home Health | Payer: Self-pay | Source: Ambulatory Visit

## 2020-07-25 ENCOUNTER — Other Ambulatory Visit: Payer: Self-pay

## 2020-07-25 DIAGNOSIS — Z20822 Contact with and (suspected) exposure to covid-19: Secondary | ICD-10-CM | POA: Diagnosis present

## 2020-07-25 DIAGNOSIS — Z7984 Long term (current) use of oral hypoglycemic drugs: Secondary | ICD-10-CM

## 2020-07-25 DIAGNOSIS — E1165 Type 2 diabetes mellitus with hyperglycemia: Secondary | ICD-10-CM | POA: Diagnosis present

## 2020-07-25 DIAGNOSIS — Z803 Family history of malignant neoplasm of breast: Secondary | ICD-10-CM | POA: Diagnosis not present

## 2020-07-25 DIAGNOSIS — Z9889 Other specified postprocedural states: Secondary | ICD-10-CM

## 2020-07-25 DIAGNOSIS — Z833 Family history of diabetes mellitus: Secondary | ICD-10-CM

## 2020-07-25 DIAGNOSIS — B954 Other streptococcus as the cause of diseases classified elsewhere: Secondary | ICD-10-CM | POA: Diagnosis present

## 2020-07-25 DIAGNOSIS — L0231 Cutaneous abscess of buttock: Secondary | ICD-10-CM | POA: Diagnosis present

## 2020-07-25 DIAGNOSIS — F411 Generalized anxiety disorder: Secondary | ICD-10-CM | POA: Diagnosis present

## 2020-07-25 DIAGNOSIS — Z8249 Family history of ischemic heart disease and other diseases of the circulatory system: Secondary | ICD-10-CM | POA: Diagnosis not present

## 2020-07-25 DIAGNOSIS — Z8261 Family history of arthritis: Secondary | ICD-10-CM | POA: Diagnosis not present

## 2020-07-25 DIAGNOSIS — L02415 Cutaneous abscess of right lower limb: Secondary | ICD-10-CM

## 2020-07-25 DIAGNOSIS — D62 Acute posthemorrhagic anemia: Secondary | ICD-10-CM | POA: Diagnosis present

## 2020-07-25 DIAGNOSIS — Z7982 Long term (current) use of aspirin: Secondary | ICD-10-CM | POA: Diagnosis not present

## 2020-07-25 DIAGNOSIS — L0291 Cutaneous abscess, unspecified: Secondary | ICD-10-CM

## 2020-07-25 HISTORY — PX: IRRIGATION AND DEBRIDEMENT ABSCESS: SHX5252

## 2020-07-25 LAB — COMPREHENSIVE METABOLIC PANEL
ALT: 73 U/L — ABNORMAL HIGH (ref 0–44)
AST: 77 U/L — ABNORMAL HIGH (ref 15–41)
Albumin: 2.6 g/dL — ABNORMAL LOW (ref 3.5–5.0)
Alkaline Phosphatase: 347 U/L — ABNORMAL HIGH (ref 38–126)
Anion gap: 17 — ABNORMAL HIGH (ref 5–15)
BUN: 16 mg/dL (ref 8–23)
CO2: 18 mmol/L — ABNORMAL LOW (ref 22–32)
Calcium: 9.5 mg/dL (ref 8.9–10.3)
Chloride: 94 mmol/L — ABNORMAL LOW (ref 98–111)
Creatinine, Ser: 0.79 mg/dL (ref 0.44–1.00)
GFR, Estimated: >60 mL/min (ref >60)
Glucose, Bld: 417 mg/dL — ABNORMAL HIGH (ref 70–99)
Potassium: 4.4 mmol/L (ref 3.5–5.1)
Sodium: 129 mmol/L — ABNORMAL LOW (ref 135–145)
Total Bilirubin: 1 mg/dL (ref 0.3–1.2)
Total Protein: 7.4 g/dL (ref 6.5–8.1)

## 2020-07-25 LAB — URINALYSIS, ROUTINE W REFLEX MICROSCOPIC
Bacteria, UA: NONE SEEN
Bilirubin Urine: NEGATIVE
Glucose, UA: >=500 mg/dL — AB
Ketones, ur: 20 mg/dL — AB
Leukocytes,Ua: NEGATIVE
Nitrite: NEGATIVE
Protein, ur: 30 mg/dL — AB
Specific Gravity, Urine: 1.021 (ref 1.005–1.030)
pH: 5 (ref 5.0–8.0)

## 2020-07-25 LAB — CBC WITH DIFFERENTIAL/PLATELET
Abs Immature Granulocytes: 0.17 10*3/uL — ABNORMAL HIGH (ref 0.00–0.07)
Basophils Absolute: 0 10*3/uL (ref 0.0–0.1)
Basophils Relative: 0 %
Eosinophils Absolute: 0 10*3/uL (ref 0.0–0.5)
Eosinophils Relative: 0 %
HCT: 26.4 % — ABNORMAL LOW (ref 36.0–46.0)
Hemoglobin: 8 g/dL — ABNORMAL LOW (ref 12.0–15.0)
Immature Granulocytes: 1 %
Lymphocytes Relative: 7 %
Lymphs Abs: 1.2 10*3/uL (ref 0.7–4.0)
MCH: 21.4 pg — ABNORMAL LOW (ref 26.0–34.0)
MCHC: 30.3 g/dL (ref 30.0–36.0)
MCV: 70.8 fL — ABNORMAL LOW (ref 80.0–100.0)
Monocytes Absolute: 0.9 10*3/uL (ref 0.1–1.0)
Monocytes Relative: 5 %
Neutro Abs: 15.3 10*3/uL — ABNORMAL HIGH (ref 1.7–7.7)
Neutrophils Relative %: 87 %
Platelets: 617 10*3/uL — ABNORMAL HIGH (ref 150–400)
RBC: 3.73 MIL/uL — ABNORMAL LOW (ref 3.87–5.11)
RDW: 18.8 % — ABNORMAL HIGH (ref 11.5–15.5)
WBC: 17.7 10*3/uL — ABNORMAL HIGH (ref 4.0–10.5)
nRBC: 0 % (ref 0.0–0.2)
nRBC: 0 /100 WBC

## 2020-07-25 LAB — SARS CORONAVIRUS 2 BY RT PCR (HOSPITAL ORDER, PERFORMED IN ~~LOC~~ HOSPITAL LAB): SARS Coronavirus 2: NEGATIVE

## 2020-07-25 LAB — PROTIME-INR
INR: 1.5 — ABNORMAL HIGH (ref 0.8–1.2)
Prothrombin Time: 17.2 seconds — ABNORMAL HIGH (ref 11.4–15.2)

## 2020-07-25 LAB — GLUCOSE, CAPILLARY
Glucose-Capillary: 126 mg/dL — ABNORMAL HIGH (ref 70–99)
Glucose-Capillary: 69 mg/dL — ABNORMAL LOW (ref 70–99)
Glucose-Capillary: 233 mg/dL — ABNORMAL HIGH (ref 70–99)

## 2020-07-25 LAB — CBG MONITORING, ED: Glucose-Capillary: 332 mg/dL — ABNORMAL HIGH (ref 70–99)

## 2020-07-25 LAB — HEMOGLOBIN A1C
Hgb A1c MFr Bld: 9.2 % — ABNORMAL HIGH (ref 4.8–5.6)
Mean Plasma Glucose: 217.34 mg/dL

## 2020-07-25 LAB — LACTIC ACID, PLASMA
Lactic Acid, Venous: 2.3 mmol/L (ref 0.5–1.9)
Lactic Acid, Venous: 1.8 mmol/L (ref 0.5–1.9)

## 2020-07-25 SURGERY — IRRIGATION AND DEBRIDEMENT ABSCESS
Anesthesia: Monitor Anesthesia Care | Site: Buttocks | Laterality: Right

## 2020-07-25 MED ORDER — SODIUM CHLORIDE 0.9 % IV BOLUS
1000.0000 mL | Freq: Once | INTRAVENOUS | Status: AC
  Administered 2020-07-25: 1000 mL via INTRAVENOUS

## 2020-07-25 MED ORDER — MIDAZOLAM HCL 2 MG/2ML IJ SOLN
INTRAMUSCULAR | Status: DC | PRN
  Administered 2020-07-25: 1 mg via INTRAVENOUS

## 2020-07-25 MED ORDER — VANCOMYCIN HCL 750 MG/150ML IV SOLN
750.0000 mg | INTRAVENOUS | Status: DC
  Administered 2020-07-26 – 2020-07-27 (×2): 750 mg via INTRAVENOUS
  Filled 2020-07-25 (×3): qty 150

## 2020-07-25 MED ORDER — PHENYLEPHRINE HCL-NACL 10-0.9 MG/250ML-% IV SOLN
INTRAVENOUS | Status: DC | PRN
  Administered 2020-07-25: 50 ug/min via INTRAVENOUS

## 2020-07-25 MED ORDER — HYDRALAZINE HCL 20 MG/ML IJ SOLN: 10.0000 mg | INTRAMUSCULAR | Status: DC | PRN

## 2020-07-25 MED ORDER — ALBUMIN HUMAN 5 % IV SOLN: INTRAVENOUS | Status: DC | PRN

## 2020-07-25 MED ORDER — FENTANYL CITRATE (PF) 250 MCG/5ML IJ SOLN
INTRAMUSCULAR | Status: AC
  Filled 2020-07-25: qty 5

## 2020-07-25 MED ORDER — PROPOFOL 10 MG/ML IV BOLUS
INTRAVENOUS | Status: AC
  Filled 2020-07-25: qty 20

## 2020-07-25 MED ORDER — BUPIVACAINE-EPINEPHRINE (PF) 0.25% -1:200000 IJ SOLN
INTRAMUSCULAR | Status: DC | PRN
  Administered 2020-07-25: 8 mL

## 2020-07-25 MED ORDER — MIDAZOLAM HCL 2 MG/2ML IJ SOLN
INTRAMUSCULAR | Status: AC
  Filled 2020-07-25: qty 2

## 2020-07-25 MED ORDER — FENTANYL CITRATE (PF) 250 MCG/5ML IJ SOLN
INTRAMUSCULAR | Status: DC | PRN
  Administered 2020-07-25: 25 ug via INTRAVENOUS
  Administered 2020-07-25: 50 ug via INTRAVENOUS
  Administered 2020-07-25: 25 ug via INTRAVENOUS

## 2020-07-25 MED ORDER — 0.9 % SODIUM CHLORIDE (POUR BTL) OPTIME
TOPICAL | Status: DC | PRN
  Administered 2020-07-25: 1000 mL

## 2020-07-25 MED ORDER — BUPIVACAINE-EPINEPHRINE (PF) 0.25% -1:200000 IJ SOLN
INTRAMUSCULAR | Status: AC
  Filled 2020-07-25: qty 30

## 2020-07-25 MED ORDER — BISACODYL 10 MG RE SUPP: 10.0000 mg | Freq: Every day | RECTAL | Status: DC | PRN

## 2020-07-25 MED ORDER — DIPHENHYDRAMINE HCL 25 MG PO CAPS
25.0000 mg | ORAL_CAPSULE | Freq: Four times a day (QID) | ORAL | Status: DC | PRN
  Administered 2020-07-26: 25 mg via ORAL
  Filled 2020-07-25: qty 1

## 2020-07-25 MED ORDER — LACTATED RINGERS IV SOLN: INTRAVENOUS | Status: DC | PRN

## 2020-07-25 MED ORDER — ACETAMINOPHEN 500 MG PO TABS
1000.0000 mg | ORAL_TABLET | Freq: Four times a day (QID) | ORAL | Status: DC
  Administered 2020-07-26 – 2020-07-28 (×10): 1000 mg via ORAL
  Filled 2020-07-25 (×11): qty 2

## 2020-07-25 MED ORDER — INSULIN ASPART 100 UNIT/ML ~~LOC~~ SOLN
0.0000 [IU] | SUBCUTANEOUS | Status: DC
  Administered 2020-07-25: 15 [IU] via SUBCUTANEOUS
  Administered 2020-07-26: 4 [IU] via SUBCUTANEOUS
  Administered 2020-07-26: 7 [IU] via SUBCUTANEOUS
  Administered 2020-07-26: 11 [IU] via SUBCUTANEOUS
  Administered 2020-07-26: 7 [IU] via SUBCUTANEOUS

## 2020-07-25 MED ORDER — ENOXAPARIN SODIUM 40 MG/0.4ML ~~LOC~~ SOLN: 40.0000 mg | SUBCUTANEOUS | Status: DC

## 2020-07-25 MED ORDER — DIPHENHYDRAMINE HCL 50 MG/ML IJ SOLN
25.0000 mg | Freq: Four times a day (QID) | INTRAMUSCULAR | Status: DC | PRN
  Administered 2020-07-27: 25 mg via INTRAVENOUS
  Filled 2020-07-25: qty 1

## 2020-07-25 MED ORDER — AMISULPRIDE (ANTIEMETIC) 5 MG/2ML IV SOLN: 10.0000 mg | Freq: Once | INTRAVENOUS | Status: DC | PRN

## 2020-07-25 MED ORDER — VANCOMYCIN HCL 1250 MG/250ML IV SOLN
1250.0000 mg | Freq: Once | INTRAVENOUS | Status: AC
  Administered 2020-07-25: 1250 mg via INTRAVENOUS
  Filled 2020-07-25: qty 250

## 2020-07-25 MED ORDER — OXYCODONE HCL 5 MG PO TABS
5.0000 mg | ORAL_TABLET | ORAL | Status: DC | PRN
  Administered 2020-07-25 – 2020-07-26 (×2): 5 mg via ORAL
  Administered 2020-07-27 – 2020-07-28 (×3): 10 mg via ORAL
  Filled 2020-07-25: qty 1
  Filled 2020-07-25: qty 2
  Filled 2020-07-25: qty 1
  Filled 2020-07-25 (×2): qty 2

## 2020-07-25 MED ORDER — ONDANSETRON 4 MG PO TBDP
4.0000 mg | ORAL_TABLET | Freq: Four times a day (QID) | ORAL | Status: DC | PRN
  Filled 2020-07-25: qty 1

## 2020-07-25 MED ORDER — SODIUM CHLORIDE 0.9 % IV SOLN: INTRAVENOUS | Status: DC

## 2020-07-25 MED ORDER — METOPROLOL TARTRATE 5 MG/5ML IV SOLN: 5.0000 mg | Freq: Four times a day (QID) | INTRAVENOUS | Status: DC | PRN

## 2020-07-25 MED ORDER — ALBUMIN HUMAN 5 % IV SOLN
INTRAVENOUS | Status: AC
  Filled 2020-07-25: qty 250

## 2020-07-25 MED ORDER — ONDANSETRON HCL 4 MG/2ML IJ SOLN
INTRAMUSCULAR | Status: DC | PRN
  Administered 2020-07-25: 4 mg via INTRAVENOUS

## 2020-07-25 MED ORDER — HYDROMORPHONE HCL 1 MG/ML IJ SOLN
0.5000 mg | INTRAMUSCULAR | Status: DC | PRN
  Administered 2020-07-28: 0.5 mg via INTRAVENOUS
  Filled 2020-07-25: qty 0.5

## 2020-07-25 MED ORDER — ONDANSETRON HCL 4 MG/2ML IJ SOLN: 4.0000 mg | Freq: Four times a day (QID) | INTRAMUSCULAR | Status: DC | PRN

## 2020-07-25 MED ORDER — FENTANYL CITRATE (PF) 100 MCG/2ML IJ SOLN
INTRAMUSCULAR | Status: AC
  Administered 2020-07-25: 50 ug via INTRAVENOUS
  Filled 2020-07-25: qty 2

## 2020-07-25 MED ORDER — ALBUMIN HUMAN 5 % IV SOLN
12.5000 g | Freq: Once | INTRAVENOUS | Status: AC
  Administered 2020-07-25: 12.5 g via INTRAVENOUS

## 2020-07-25 MED ORDER — MORPHINE SULFATE (PF) 4 MG/ML IV SOLN
4.0000 mg | Freq: Once | INTRAVENOUS | Status: AC
Start: 2020-07-25 — End: 2020-07-25
  Administered 2020-07-25: 4 mg via INTRAVENOUS
  Filled 2020-07-25: qty 1

## 2020-07-25 MED ORDER — FENTANYL CITRATE (PF) 100 MCG/2ML IJ SOLN: 25.0000 ug | INTRAMUSCULAR | Status: DC | PRN

## 2020-07-25 MED ORDER — PROPOFOL 500 MG/50ML IV EMUL
INTRAVENOUS | Status: DC | PRN
  Administered 2020-07-25: 100 ug/kg/min via INTRAVENOUS

## 2020-07-25 MED ORDER — DOCUSATE SODIUM 100 MG PO CAPS
100.0000 mg | ORAL_CAPSULE | Freq: Two times a day (BID) | ORAL | Status: DC
  Administered 2020-07-26 – 2020-07-28 (×6): 100 mg via ORAL
  Filled 2020-07-25 (×7): qty 1

## 2020-07-25 SURGICAL SUPPLY — 36 items
BLADE CLIPPER SURG (BLADE) IMPLANT
BNDG GAUZE ELAST 4 BULKY (GAUZE/BANDAGES/DRESSINGS) ×2 IMPLANT
CANISTER SUCT 3000ML PPV (MISCELLANEOUS) ×2 IMPLANT
COVER SURGICAL LIGHT HANDLE (MISCELLANEOUS) ×2 IMPLANT
COVER WAND RF STERILE (DRAPES) ×2 IMPLANT
DRAIN PENROSE 1/2X12 LTX STRL (WOUND CARE) ×2 IMPLANT
DRAPE HALF SHEET 40X57 (DRAPES) ×2 IMPLANT
DRAPE LAPAROSCOPIC ABDOMINAL (DRAPES) IMPLANT
DRAPE LAPAROTOMY 100X72 PEDS (DRAPES) IMPLANT
DRSG PAD ABDOMINAL 8X10 ST (GAUZE/BANDAGES/DRESSINGS) IMPLANT
ELECT REM PT RETURN 9FT ADLT (ELECTROSURGICAL) ×2
ELECTRODE REM PT RTRN 9FT ADLT (ELECTROSURGICAL) ×1 IMPLANT
GAUZE SPONGE 4X4 12PLY STRL (GAUZE/BANDAGES/DRESSINGS) IMPLANT
GAUZE SPONGE 4X4 12PLY STRL LF (GAUZE/BANDAGES/DRESSINGS) ×2 IMPLANT
GLOVE BIO SURGEON STRL SZ 6 (GLOVE) ×2 IMPLANT
GLOVE SURG UNDER LTX SZ6.5 (GLOVE) ×2 IMPLANT
GOWN STRL REUS W/ TWL LRG LVL3 (GOWN DISPOSABLE) ×2 IMPLANT
GOWN STRL REUS W/TWL LRG LVL3 (GOWN DISPOSABLE) ×4
KIT BASIN OR (CUSTOM PROCEDURE TRAY) ×2 IMPLANT
KIT TURNOVER KIT B (KITS) ×2 IMPLANT
LEGGING LITHOTOMY PAIR STRL (DRAPES) ×2 IMPLANT
NEEDLE HYPO 25GX1X1/2 BEV (NEEDLE) ×2 IMPLANT
NS IRRIG 1000ML POUR BTL (IV SOLUTION) ×2 IMPLANT
PACK GENERAL/GYN (CUSTOM PROCEDURE TRAY) ×2 IMPLANT
PACK LITHOTOMY IV (CUSTOM PROCEDURE TRAY) IMPLANT
PAD ABD 7.5X8 STRL (GAUZE/BANDAGES/DRESSINGS) ×2 IMPLANT
PAD ARMBOARD 7.5X6 YLW CONV (MISCELLANEOUS) ×2 IMPLANT
PENCIL SMOKE EVACUATOR (MISCELLANEOUS) ×2 IMPLANT
SUT ETHILON 3 0 FSL (SUTURE) ×2 IMPLANT
SWAB COLLECTION DEVICE MRSA (MISCELLANEOUS) ×2 IMPLANT
SWAB CULTURE ESWAB REG 1ML (MISCELLANEOUS) ×2 IMPLANT
SYR CONTROL 10ML LL (SYRINGE) ×2 IMPLANT
TAPE CLOTH SURG 6X10 WHT LF (GAUZE/BANDAGES/DRESSINGS) ×2 IMPLANT
TOWEL GREEN STERILE (TOWEL DISPOSABLE) IMPLANT
TOWEL GREEN STERILE FF (TOWEL DISPOSABLE) ×2 IMPLANT
UNDERPAD 30X36 HEAVY ABSORB (UNDERPADS AND DIAPERS) ×2 IMPLANT

## 2020-07-25 NOTE — Transfer of Care (Signed)
Immediate Anesthesia Transfer of Care Note  Patient: Stephanie Stokes  Procedure(s) Performed: IRRIGATION AND DEBRIDEMENT ABSCESS (Right Buttocks)  Patient Location: PACU  Anesthesia Type:MAC  Level of Consciousness: awake, alert  and oriented  Airway & Oxygen Therapy: Patient Spontanous Breathing  Post-op Assessment: Report given to RN and Post -op Vital signs reviewed and stable  Post vital signs: Reviewed and stable  Last Vitals:  Vitals Value Taken Time  BP 112/65 07/25/20 1810  Temp    Pulse 124 07/25/20 1812  Resp 26 07/25/20 1812  SpO2 93 % 07/25/20 1812  Vitals shown include unvalidated device data.  Last Pain:  Vitals:   07/25/20 1418  TempSrc:   PainSc: 8          Complications: No complications documented.

## 2020-07-25 NOTE — Anesthesia Postprocedure Evaluation (Signed)
Anesthesia Post Note  Patient: Stephanie Stokes  Procedure(s) Performed: IRRIGATION AND DEBRIDEMENT ABSCESS (Right Buttocks)     Patient location during evaluation: PACU Anesthesia Type: MAC Level of consciousness: awake and alert Pain management: pain level controlled Vital Signs Assessment: post-procedure vital signs reviewed and stable Respiratory status: spontaneous breathing, nonlabored ventilation, respiratory function stable and patient connected to nasal cannula oxygen Cardiovascular status: stable and blood pressure returned to baseline Postop Assessment: no apparent nausea or vomiting Anesthetic complications: no   No complications documented.  Last Vitals:  Vitals:   07/25/20 1430 07/25/20 1515  BP: (!) 95/54 110/62  Pulse:  (!) 129  Resp: (!) 28 (!) 30  Temp:    SpO2:  95%    Last Pain:  Vitals:   07/25/20 1418  TempSrc:   PainSc: 8                  Tiajuana Amass

## 2020-07-25 NOTE — Op Note (Addendum)
Operative Note  Stephanie Stokes  [id number]  [id number]  07/25/2020   Surgeon: Leeroy Bock ConnorMD  Procedure performed: Incision and debridement of right gluteal abscess- with excision of 5 x 7 cm skin and subcutaneous tissue, abscess cavity debrided measured 15 cm laterally,10 cm inferiorly, 8 cm superiorly and about 6 cm deep.  Preop diagnosis: Gluteal abscess Post-op diagnosis/intraop findings: Same, containing pus  Specimens: Cultures Retained items: Penrose drain and Kerlix x2 EBL: 20cc Complications: none  Description of procedure: After obtaining informed consent the patient was taken to the operating room and placed on the OR table in the left lateral decubitus position.  Monitored anesthesia care was initiated.  She is on standing antibiotics. SCDs were applied and a formal timeout was performed.  The buttocks and right hip were prepped and draped in usual sterile fashion.  After infiltration with quarter percent Marcaine with epinephrine, a stab incision was made in the area of most fluctuance with a thin the skin which was the medial superior aspect of the abscess.  This yielded an immediate egress of projectile purulent drainage.  The wound was extended and the drainage was cultured.  A approximately 5 x 7 cm ellipse of thinned skin was excised using the cautery to create a wide opening.  The abscess cavity was then bluntly probed and purulence evacuated.  The abscess cavity extends about 15 cm laterally,10 cm inferiorly, 8 cm superiorly and about 6 cm deep.  The wound was irrigated with warm sterile saline.  A counterincision was made laterally and a 1 inch Penrose drain was started through the cavity and sutured to itself with 3-0 nylon's.  The abscess cavity was then packed with saline moistened Kerlix.  There are 2 pieces which are tied together.  A dry dressing was applied and the patient was then awakened and taken to the recovery room in stable condition.   All counts  were correct at the completion of the case.

## 2020-07-25 NOTE — Discharge Instructions (Signed)
Please report to the emergency room now for CT imaging of your severe abscess, further evaluation and intervention than we can provide in the urgent care setting.

## 2020-07-25 NOTE — H&P (Signed)
Surgical Evaluation  Chief Complaint: abscess  HPI: 65yo woman with diabetes who presents with 2-3 days of gluteal pain and swelling which has markedly worsened over the last 24h. She had a fall a few weeks ago and had some ongoing pain in the right hip from that. Has been working with PT. Noted worsening pain and PT looked at the area and noted a large abscess, so she was referred to urgent care and then to ER.  Allergies  Allergen Reactions  . Cephalexin Rash    Past Medical History:  Diagnosis Date  . Anal fissure    Dr. Loreta Ave  . Colon polyp   . Diabetes mellitus without complication (HCC)    diet and exercise controlled    Past Surgical History:  Procedure Laterality Date  . ANAL FISSURE REPAIR  10/20/10   Dr. Loreta Ave  . CESAREAN SECTION   02/15/79 & 03/31/83  . PELVIC LAPAROSCOPY  02/22/79   lysis of adhesions  . TONSILLECTOMY  age 65    Family History  Problem Relation Age of Onset  . Diabetes Sister   . Osteoarthritis Sister   . Breast cancer Maternal Aunt   . Cancer Maternal Grandfather   . Heart failure Paternal Grandfather     Social History   Socioeconomic History  . Marital status: Married    Spouse name: Not on file  . Number of children: 2  . Years of education: Not on file  . Highest education level: Not on file  Occupational History    Employer: [employer]  Tobacco Use  . Smoking status: Never Smoker  . Smokeless tobacco: Never Used  Substance and Sexual Activity  . Alcohol use: No  . Drug use: No  . Sexual activity: Not on file  Other Topics Concern  . Not on file  Social History Narrative   Daughter [name] who had been diagnosed with Bipolar disease committed suicide on her 26 rd birthday in 2013.   Social Determinants of Health   Financial Resource Strain: Not on file  Food Insecurity: Not on file  Transportation Needs: Not on file  Physical Activity: Not on file  Stress: Not on file  Social Connections: Not on file    No  current facility-administered medications on file prior to encounter.   Current Outpatient Medications on File Prior to Encounter  Medication Sig Dispense Refill  . aspirin EC 81 MG tablet Take 81 mg by mouth daily.    Marland Kitchen atorvastatin (LIPITOR) 20 MG tablet Take 20 mg by mouth daily.    . Lancets (ONETOUCH DELICA PLUS LANCET33G) MISC Apply 1 each topically daily.    Marland Kitchen lisinopril (ZESTRIL) 2.5 MG tablet Take 2.5 mg by mouth daily.    . metFORMIN (GLUCOPHAGE-XR) 500 MG 24 hr tablet Take 1,000 mg by mouth daily. After a meal    . ONETOUCH VERIO test strip 1 each daily.    . sertraline (ZOLOFT) 100 MG tablet TAKE 2 TABLETS BY MOUTH  DAILY 180 tablet 1    Review of Systems: a complete, 10pt review of systems was completed with pertinent positives and negatives as documented in the HPI  Physical Exam: Vitals:   07/25/20 1016  BP: 118/71  Pulse: (!) 134  Resp: 16  Temp: 98.4 F (36.9 C)  SpO2: 98%   Gen: A&Ox3, no distress  Eyes: lids and conjunctivae normal, no icterus. Pupils equally round and reactive to light.  Neck: supple without mass or thyromegaly Chest: respiratory effort is normal.  No crepitus or tenderness on palpation of the chest. Breath sounds equal.  Cardiovascular: RRR with palpable distal pulses, no pedal edema Gastrointestinal: soft, nondistended, nontender. No mass, hepatomegaly or splenomegaly. No hernia. Lymphatic: no lymphadenopathy in the neck or groin Muscoloskeletal: no clubbing or cyanosis of the fingers.  Strength is symmetrical throughout.  Range of motion of bilateral upper and lower extremities normal without pain, crepitation or contracture. Neuro: cranial nerves grossly intact.  Sensation intact to light touch diffusely. Psych: appropriate mood and affect, normal insight/judgment intact  Skin: warm and dry, large right gluteal abscess with skin breakdown and punctate areas of purulent drainage   CBC Latest Ref Rng & Units 07/25/2020 01/22/2016  WBC 4.0 -  10.5 K/uL 17.7(H) -  Hemoglobin 12.0 - 15.0 g/dL 8.0(L) 12.9  Hematocrit 36.0 - 46.0 % 26.4(L) 38.0  Platelets 150 - 400 K/uL 617(H) -    CMP Latest Ref Rng & Units 07/25/2020 01/22/2016  Glucose 70 - 99 mg/dL 354(T) 625(W)  BUN 8 - 23 mg/dL 16 15  Creatinine 3.89 - 1.00 mg/dL 3.73 4.28  Sodium 768 - 145 mmol/L 129(L) 139  Potassium 3.5 - 5.1 mmol/L 4.4 4.0  Chloride 98 - 111 mmol/L 94(L) 103  CO2 22 - 32 mmol/L 18(L) -  Calcium 8.9 - 10.3 mg/dL 9.5 -  Total Protein 6.5 - 8.1 g/dL 7.4 -  Total Bilirubin 0.3 - 1.2 mg/dL 1.0 -  Alkaline Phos 38 - 126 U/L 347(H) -  AST 15 - 41 U/L 77(H) -  ALT 0 - 44 U/L 73(H) -    No results found for: INR, PROTIME  Imaging: DG Chest 2 View  Result Date: 07/25/2020 CLINICAL DATA:  65 year old female with history of suspected sepsis. EXAM: CHEST - 2 VIEW COMPARISON:  No priors. FINDINGS: Lung volumes are normal. No consolidative airspace disease. No pleural effusions. No pneumothorax. No pulmonary nodule or mass noted. Pulmonary vasculature and the cardiomediastinal silhouette are within normal limits. IMPRESSION: No radiographic evidence of acute cardiopulmonary disease. Electronically Signed   By: Trudie Reed M.D.   On: 07/25/2020 11:07     A/P: Large abscess. Recommend I&D in OR.     Patient Active Problem List   Diagnosis Date Noted  . PTSD (post-traumatic stress disorder) 07/19/2018  . GAD (generalized anxiety disorder) 07/19/2018       Phylliss Blakes, MD [facility]  See AMION to contact appropriate on-call provider

## 2020-07-25 NOTE — ED Triage Notes (Signed)
Correction: There is one abscess to rt buttocks, minimal drainage x 2 days. Pain 7/10

## 2020-07-25 NOTE — ED Notes (Signed)
Pt transported to Short Stay w/o incident. Prior to transport. Pt unhooked herself from all monitoring equipment got out of bed and urinated in the trash can. Notified Vira Browns of all that happened.

## 2020-07-25 NOTE — Progress Notes (Signed)
EKG ST. Progressive bed order placed due to ST and concern for 6N not being able to manage per attending provider. Patient anxious due to using purewick catheter and requesting to get up. Request denied due to ST. Pt unable to use bedpan due to surgical site.

## 2020-07-25 NOTE — Anesthesia Preprocedure Evaluation (Addendum)
Anesthesia Evaluation  Patient identified by MRN, date of birth, ID band Patient awake    Reviewed: Allergy & Precautions, NPO status , Patient's Chart, lab work & pertinent test results  Airway Mallampati: II  TM Distance: >3 FB Neck ROM: Full    Dental  (+) Dental Advisory Given   Pulmonary neg pulmonary ROS,    breath sounds clear to auscultation       Cardiovascular negative cardio ROS   Rhythm:Regular Rate:Normal     Neuro/Psych negative neurological ROS     GI/Hepatic negative GI ROS, Neg liver ROS,   Endo/Other  diabetes, Poorly Controlled, Type 2  Renal/GU negative Renal ROS     Musculoskeletal   Abdominal   Peds  Hematology  (+) anemia ,   Anesthesia Other Findings   Reproductive/Obstetrics                             Anesthesia Physical Anesthesia Plan  ASA: III and emergent  Anesthesia Plan: MAC   Post-op Pain Management:    Induction:   PONV Risk Score and Plan: 2 and Propofol infusion, Ondansetron and Treatment may vary due to age or medical condition  Airway Management Planned: Natural Airway and Simple Face Mask  Additional Equipment:   Intra-op Plan:   Post-operative Plan:   Informed Consent: I have reviewed the patients History and Physical, chart, labs and discussed the procedure including the risks, benefits and alternatives for the proposed anesthesia with the patient or authorized representative who has indicated his/her understanding and acceptance.       Plan Discussed with:   Anesthesia Plan Comments:        Anesthesia Quick Evaluation

## 2020-07-25 NOTE — Progress Notes (Signed)
Spoke to Dr. Freida Busman General surgery who is assuming care. Patient ST 130-140 HR and discussed management. Dr. Freida Busman requested an EKG and H&H. She states the patient is appropriate for 6N due the patient mentation is AO x 4 and following commands and currently volume replaced. BP trending upward in right direction. Communication passed on to [employer]. Anesthesia team has no concerns for patient's hemodynamic status related to surgery and passes care to attending provider.

## 2020-07-25 NOTE — ED Triage Notes (Addendum)
Pt presents with one abscess (rt buttock) +drainage. Pain 7/10

## 2020-07-25 NOTE — ED Triage Notes (Signed)
Was sent by UC d/t abscess on right buttocks

## 2020-07-25 NOTE — Progress Notes (Signed)
Pharmacy Antibiotic Note  Stephanie Stokes is a 65 y.o. female admitted on 07/25/2020 with abscess in buttocks w/drainage.  Pharmacy has been consulted for vancomycin dosing.  Plan: Vancomycin 1250 mg IV x 1, then 750 mg IV every 24 hours (Est AUC 429, Goal AUC 400-550, SCr used 0.8) Monitor renal function, Cx and clinical progression to narrow Vancomycin levels as needed  Height: 4\' 11"  (149.9 cm) Weight: 54.4 kg (120 lb) IBW/kg (Calculated) : 43.2  Temp (24hrs), Avg:98 F (36.7 C), Min:97.5 F (36.4 C), Max:98.4 F (36.9 C)  Recent Labs  Lab 07/25/20 1027  WBC 17.7*  CREATININE 0.79  LATICACIDVEN 2.3*    Estimated Creatinine Clearance: 53.5 mL/min (by C-G formula based on SCr of 0.79 mg/dL).    Allergies  Allergen Reactions  . Cephalexin Rash    07/27/20, PharmD Clinical Pharmacist ED Pharmacist Phone # [phone] 07/25/2020 12:03 PM

## 2020-07-25 NOTE — ED Notes (Signed)
Lactic 2.3, notified PA

## 2020-07-25 NOTE — ED Provider Notes (Signed)
[facility] EMERGENCY DEPARTMENT Provider Note   CSN: [id number] Arrival date & time: 07/25/20  4098     History Chief Complaint  Patient presents with  . Abscess    Stephanie Stokes is a 65 y.o. female.  65 year old female with prior medical history detailed below presents for evaluation of abscess to the right buttock.  Patient reports onset of pain and swelling to the right buttock over the last 2 to 3 days.  Increased swelling over the last 24 hours.  Patient presented to an urgent care this morning for evaluation.  She was referred to the ED for emergent management.  Patient is diabetic.  She denies known COVID contact.  Last p.o. intake was at 6 AM.    The history is provided by the patient and medical records.  Abscess Abscess location: Right buttock. Abscess quality: fluctuance, induration, painful and redness   Red streaking: yes   Duration:  3 days Progression:  Worsening Pain details:    Quality:  Throbbing and pressure Context: diabetes   Relieved by:  Nothing Worsened by:  Nothing      Past Medical History:  Diagnosis Date  . Anal fissure    Dr. Loreta Ave  . Colon polyp   . Diabetes mellitus without complication (HCC)    diet and exercise controlled    Patient Active Problem List   Diagnosis Date Noted  . PTSD (post-traumatic stress disorder) 07/19/2018  . GAD (generalized anxiety disorder) 07/19/2018    Past Surgical History:  Procedure Laterality Date  . ANAL FISSURE REPAIR  10/20/10   Dr. Loreta Ave  . CESAREAN SECTION   02/15/79 & 03/31/83  . PELVIC LAPAROSCOPY  02/22/79   lysis of adhesions  . TONSILLECTOMY  age 64     OB History    Gravida  2   Para  2   Term  2   Preterm      AB      Living  2     SAB      IAB      Ectopic      Multiple      Live Births              Family History  Problem Relation Age of Onset  . Diabetes Sister   . Osteoarthritis Sister   . Breast cancer Maternal Aunt   . Cancer  Maternal Grandfather   . Heart failure Paternal Grandfather     Social History   Tobacco Use  . Smoking status: Never Smoker  . Smokeless tobacco: Never Used  Substance Use Topics  . Alcohol use: No  . Drug use: No    Home Medications Prior to Admission medications   Medication Sig Start Date End Date Taking? Authorizing Provider  aspirin EC 81 MG tablet Take 81 mg by mouth daily.    [provider]  atorvastatin (LIPITOR) 20 MG tablet Take 20 mg by mouth daily. 03/17/20   [provider]  Lancets (ONETOUCH DELICA PLUS [place]) MISC Apply 1 each topically daily. 02/20/20   [provider]  lisinopril (ZESTRIL) 2.5 MG tablet Take 2.5 mg by mouth daily. 02/01/20   [provider]  metFORMIN (GLUCOPHAGE-XR) 500 MG 24 hr tablet Take 1,000 mg by mouth daily. After a meal 03/12/20   [provider]  [facility] VERIO test strip 1 each daily. 03/08/20   [provider]  sertraline (ZOLOFT) 100 MG tablet TAKE 2 TABLETS BY MOUTH  DAILY 07/01/20  Cottle, Steva Ready., MD    Allergies    Cephalexin  Review of Systems   Review of Systems  All other systems reviewed and are negative.   Physical Exam Updated Vital Signs BP 118/71 (BP Location: Right Arm)   Pulse (!) 134   Temp 98.4 F (36.9 C) (Oral)   Resp 16   Ht 4\' 11"  (1.499 m)   Wt 54.4 kg   SpO2 98%   BMI 24.24 kg/m   Physical Exam Vitals and nursing note reviewed.  Constitutional:      General: She is not in acute distress.    Appearance: She is well-developed and well-nourished.  HENT:     Head: Normocephalic and atraumatic.     Mouth/Throat:     Mouth: Oropharynx is clear and moist.  Eyes:     Extraocular Movements: EOM normal.     Conjunctiva/sclera: Conjunctivae normal.     Pupils: Pupils are equal, round, and reactive to light.  Cardiovascular:     Rate and Rhythm: Normal rate and regular rhythm.     Heart sounds: Normal heart sounds.  Pulmonary:     Effort:  Pulmonary effort is normal. No respiratory distress.     Breath sounds: Normal breath sounds.  Abdominal:     General: There is no distension.     Palpations: Abdomen is soft.     Tenderness: There is no abdominal tenderness.  Musculoskeletal:        General: No deformity or edema. Normal range of motion.     Cervical back: Normal range of motion and neck supple.  Skin:    General: Skin is warm and dry.     Comments: See photo below of large abscess, right buttock.  Neurological:     Mental Status: She is alert and oriented to person, place, and time.  Psychiatric:        Mood and Affect: Mood and affect normal.         ED Results / Procedures / Treatments   Labs (all labs ordered are listed, but only abnormal results are displayed) Labs Reviewed  COMPREHENSIVE METABOLIC PANEL - Abnormal; Notable for the following components:      Result Value   Sodium 129 (*)    Chloride 94 (*)    CO2 18 (*)    Glucose, Bld 417 (*)    Albumin 2.6 (*)    AST 77 (*)    ALT 73 (*)    Alkaline Phosphatase 347 (*)    Anion gap 17 (*)    All other components within normal limits  LACTIC ACID, PLASMA - Abnormal; Notable for the following components:   Lactic Acid, Venous 2.3 (*)    All other components within normal limits  CBC WITH DIFFERENTIAL/PLATELET - Abnormal; Notable for the following components:   WBC 17.7 (*)    RBC 3.73 (*)    Hemoglobin 8.0 (*)    HCT 26.4 (*)    MCV 70.8 (*)    MCH 21.4 (*)    RDW 18.8 (*)    Platelets 617 (*)    Neutro Abs 15.3 (*)    Abs Immature Granulocytes 0.17 (*)    All other components within normal limits  CULTURE, BLOOD (ROUTINE X 2)  CULTURE, BLOOD (ROUTINE X 2)  SARS CORONAVIRUS 2 BY RT PCR (HOSPITAL ORDER, PERFORMED IN [facility])  LACTIC ACID, PLASMA  URINALYSIS, ROUTINE W REFLEX MICROSCOPIC  PROTIME-INR  HEMOGLOBIN A1C  EKG None  Radiology DG Chest 2 View  Result Date: 07/25/2020 CLINICAL DATA:  65 year old  female with history of suspected sepsis. EXAM: CHEST - 2 VIEW COMPARISON:  No priors. FINDINGS: Lung volumes are normal. No consolidative airspace disease. No pleural effusions. No pneumothorax. No pulmonary nodule or mass noted. Pulmonary vasculature and the cardiomediastinal silhouette are within normal limits. IMPRESSION: No radiographic evidence of acute cardiopulmonary disease. Electronically Signed   By: Trudie Reed M.D.   On: 07/25/2020 11:07    Procedures Procedures (including critical care time)  Medications Ordered in ED Medications  sodium chloride 0.9 % bolus 1,000 mL (has no administration in time range)    ED Course  I have reviewed the triage vital signs and the nursing notes.  Pertinent labs & imaging results that were available during my care of the patient were reviewed by me and considered in my medical decision making (see chart for details).    MDM Rules/Calculators/A&P                          MDM  Screen complete  Stephanie Stokes was evaluated in Emergency Department on 07/25/2020 for the symptoms described in the history of present illness. She was evaluated in the context of the global COVID-19 pandemic, which necessitated consideration that the patient might be at risk for infection with the SARS-CoV-2 virus that causes COVID-19. Institutional protocols and algorithms that pertain to the evaluation of patients at risk for COVID-19 are in a state of rapid change based on information released by regulatory bodies including the CDC and federal and state organizations. These policies and algorithms were followed during the patient's care in the ED.  Patient is presenting with large abscess on the right buttock.  Patient with signs of significant systemic response with elevated white count and elevated lactic acid.  General surgery is aware of case and will evaluate.  Likely plan to take to OR for I and D.   Patient understands plan of care.     Final Clinical  Impression(s) / ED Diagnoses Final diagnoses:  Abscess    Rx / DC Orders ED Discharge Orders    None       Wynetta Fines, MD 07/25/20 1306

## 2020-07-26 ENCOUNTER — Encounter (HOSPITAL_COMMUNITY): Payer: Self-pay | Admitting: Surgery

## 2020-07-26 LAB — GLUCOSE, CAPILLARY
Glucose-Capillary: 255 mg/dL — ABNORMAL HIGH (ref 70–99)
Glucose-Capillary: 281 mg/dL — ABNORMAL HIGH (ref 70–99)
Glucose-Capillary: 232 mg/dL — ABNORMAL HIGH (ref 70–99)
Glucose-Capillary: 170 mg/dL — ABNORMAL HIGH (ref 70–99)
Glucose-Capillary: 309 mg/dL — ABNORMAL HIGH (ref 70–99)
Glucose-Capillary: 179 mg/dL — ABNORMAL HIGH (ref 70–99)

## 2020-07-26 LAB — CBC
HCT: 17.2 % — ABNORMAL LOW (ref 36.0–46.0)
Hemoglobin: 5.3 g/dL — CL (ref 12.0–15.0)
MCH: 21.7 pg — ABNORMAL LOW (ref 26.0–34.0)
MCHC: 30.8 g/dL (ref 30.0–36.0)
MCV: 70.5 fL — ABNORMAL LOW (ref 80.0–100.0)
Platelets: 394 10*3/uL (ref 150–400)
RBC: 2.44 MIL/uL — ABNORMAL LOW (ref 3.87–5.11)
RDW: 18.7 % — ABNORMAL HIGH (ref 11.5–15.5)
WBC: 15 10*3/uL — ABNORMAL HIGH (ref 4.0–10.5)
nRBC: 0 % (ref 0.0–0.2)

## 2020-07-26 LAB — HIV ANTIBODY (ROUTINE TESTING W REFLEX): HIV Screen 4th Generation wRfx: NONREACTIVE

## 2020-07-26 LAB — BASIC METABOLIC PANEL
Anion gap: 10 (ref 5–15)
BUN: 16 mg/dL (ref 8–23)
CO2: 20 mmol/L — ABNORMAL LOW (ref 22–32)
Calcium: 8.5 mg/dL — ABNORMAL LOW (ref 8.9–10.3)
Chloride: 101 mmol/L (ref 98–111)
Creatinine, Ser: 0.92 mg/dL (ref 0.44–1.00)
GFR, Estimated: >60 mL/min (ref >60)
Glucose, Bld: 292 mg/dL — ABNORMAL HIGH (ref 70–99)
Potassium: 4.1 mmol/L (ref 3.5–5.1)
Sodium: 131 mmol/L — ABNORMAL LOW (ref 135–145)

## 2020-07-26 LAB — PREPARE RBC (CROSSMATCH)

## 2020-07-26 LAB — MAGNESIUM: Magnesium: 1.4 mg/dL — ABNORMAL LOW (ref 1.7–2.4)

## 2020-07-26 LAB — ABO/RH: ABO/RH(D): B POS

## 2020-07-26 MED ORDER — INSULIN ASPART 100 UNIT/ML ~~LOC~~ SOLN: 0.0000 [IU] | Freq: Every day | SUBCUTANEOUS | Status: DC

## 2020-07-26 MED ORDER — MAGNESIUM SULFATE 4 GM/100ML IV SOLN
4.0000 g | Freq: Once | INTRAVENOUS | Status: AC
  Administered 2020-07-26: 4 g via INTRAVENOUS
  Filled 2020-07-26: qty 100

## 2020-07-26 MED ORDER — SODIUM CHLORIDE 0.9% IV SOLUTION: Freq: Once | INTRAVENOUS | Status: DC

## 2020-07-26 MED ORDER — ACETAMINOPHEN 325 MG PO TABS: 650.0000 mg | ORAL_TABLET | Freq: Four times a day (QID) | ORAL | Status: DC | PRN

## 2020-07-26 MED ORDER — INSULIN DETEMIR 100 UNIT/ML ~~LOC~~ SOLN
6.0000 [IU] | Freq: Two times a day (BID) | SUBCUTANEOUS | Status: DC
  Administered 2020-07-26 – 2020-07-28 (×4): 6 [IU] via SUBCUTANEOUS
  Filled 2020-07-26 (×6): qty 0.06

## 2020-07-26 MED ORDER — INSULIN ASPART 100 UNIT/ML ~~LOC~~ SOLN
0.0000 [IU] | Freq: Three times a day (TID) | SUBCUTANEOUS | Status: DC
  Administered 2020-07-26: 15 [IU] via SUBCUTANEOUS
  Administered 2020-07-27: 11 [IU] via SUBCUTANEOUS
  Administered 2020-07-27: 7 [IU] via SUBCUTANEOUS
  Administered 2020-07-28: 3 [IU] via SUBCUTANEOUS

## 2020-07-26 MED ORDER — SODIUM CHLORIDE 0.9% IV SOLUTION: Freq: Once | INTRAVENOUS | Status: AC

## 2020-07-26 MED ORDER — NITROGLYCERIN 0.4 MG SL SUBL
SUBLINGUAL_TABLET | SUBLINGUAL | Status: AC
  Filled 2020-07-26: qty 1

## 2020-07-26 NOTE — Progress Notes (Addendum)
Inpatient Diabetes Program Recommendations  AACE/ADA: New Consensus Statement on Inpatient Glycemic Control (2015)  Target Ranges:  Prepandial:   less than 140 mg/dL      Peak postprandial:   less than 180 mg/dL (1-2 hours)      Critically ill patients:  140 - 180 mg/dL   Lab Results  Component Value Date   GLUCAP 232 (H) 07/26/2020   HGBA1C 9.2 (H) 07/25/2020    Review of Glycemic Control Results for Stephanie Stokes, Stephanie Stokes (MRN [id number]) as of 07/26/2020 10:25  Ref. Range 07/25/2020 13:51 07/25/2020 16:46 07/25/2020 18:15 07/25/2020 22:37 07/26/2020 00:34 07/26/2020 05:31 07/26/2020 07:27  Glucose-Capillary Latest Ref Range: 70 - 99 mg/dL 292 (H) Novolog 15 units 126 (H) 69 (L) 233 (H) 255 (H)Novolog 7 units 281 (H) Novolog 11 units 232 (H) Novolog 7 units   Diabetes history: DM2 Outpatient Diabetes medications: Metformin 500 mg XR bid Current orders for Inpatient glycemic control: Novolog 0-20 units q 4 hrs.  Inpatient Diabetes Program Recommendations:   Since patient is now eating: -Change Novolog correction to 0-9 units tid + hs 0-5 units -Add Levemir 6 units bid (0.2 units/kg x 56.7 kg = 11 units) Will follow patient during hospitalization.  Spoke with Patient and husband @ bedside. Discussed A1c of 9.2 (average blood glucose 217 over the past 2-3 months time). Patient has been running higher CBGs since her injury and less mobility. Answered nutrition questions. Patient has been eating and drinking low carbohydrates.  Thank you, Billy Fischer. Zalan Shidler, RN, MSN, CDE  Diabetes Coordinator Inpatient Glycemic Control Team Team Pager [phone] (8am-5pm) 07/26/2020 10:32 AM

## 2020-07-26 NOTE — Progress Notes (Signed)
Pt. HR sustaining 130s, BP 97/60. Dr. Eliot Ford aware. No new orders at this time.

## 2020-07-26 NOTE — Progress Notes (Signed)
Patient has been tachycardic since admission to 130s. BP soft overnight. Labs this morning significant for hemoglobin of 5.3 (from 8.0 preop). I examined the patient's wound at bedside - packing is in place, clean and not saturated with blood, no active bleeding noted. ABD has serosanguinous drainage. Will transfuse this morning.

## 2020-07-26 NOTE — Plan of Care (Signed)
?  Problem: Coping: ?Goal: Level of anxiety will decrease ?Outcome: Progressing ?  ?Problem: Safety: ?Goal: Ability to remain free from injury will improve ?Outcome: Progressing ?  ?

## 2020-07-26 NOTE — Progress Notes (Signed)
Hgb 5.3. Page sent to S. Freida Busman, MD to make aware.

## 2020-07-26 NOTE — Progress Notes (Signed)
Blood restarted at 100 ML/HR. Rapid nurse at the bedside and assessed the Pt as well. Orders received from doctor that was changed.

## 2020-07-26 NOTE — Progress Notes (Signed)
New orders received to transfuse RBC.

## 2020-07-26 NOTE — Progress Notes (Signed)
Subjective No acute events. Feeling well - better than pre-I&D. Denies complaints at this time. She denies n/v, she denies abdominal pain. She denies any bloody stool -  Reports brown BMs.  Objective: Vital signs in last 24 hours: Temp:  [97.7 F (36.5 C)-100.9 F (38.3 C)] 97.9 F (36.6 C) (01/24 0824) Pulse Rate:  [107-144] 111 (01/24 0824) Resp:  [16-32] 20 (01/24 0824) BP: (80-118)/(51-71) 92/66 (01/24 0824) SpO2:  [85 %-100 %] 100 % (01/24 0824) Weight:  [54.4 kg-56.7 kg] 56.7 kg (01/23 2200) Last BM Date: 07/25/20  Intake/Output from previous day: 01/23 0701 - 01/24 0700 In: 1045 [P.O.:240; I.V.:300; IV Piggyback:505] Out: 25 [Blood:25] Intake/Output this shift: Total I/O In: 120 [P.O.:120] Out: -   Gen: NAD, comfortable CV: RRR Pulm: Normal work of breathing Abd: Soft, NT/ND Skin: Right lateral gluteal abscess cavity packing removed, replaced with clean moist kerlex. Wound cleaned up nicely - no necrotic tissue, blanching erythema or non-viable skin. Penrose in place. No active purulent drainage. Ext: SCDs in place  Lab Results: CBC  Recent Labs    07/25/20 1027 07/26/20 0251  WBC 17.7* 15.0*  HGB 8.0* 5.3*  HCT 26.4* 17.2*  PLT 617* 394   BMET Recent Labs    07/25/20 1027 07/26/20 0251  NA 129* 131*  K 4.4 4.1  CL 94* 101  CO2 18* 20*  GLUCOSE 417* 292*  BUN 16 16  CREATININE 0.79 0.92  CALCIUM 9.5 8.5*   PT/INR Recent Labs    07/25/20 1308  LABPROT 17.2*  INR 1.5*   ABG No results for input(s): PHART, HCO3 in the last 72 hours.  Invalid input(s): PCO2, PO2  Studies/Results:  Anti-infectives: Anti-infectives (From admission, onward)   Start     Dose/Rate Route Frequency Ordered Stop   07/26/20 1300  vancomycin (VANCOREADY) IVPB 750 mg/150 mL        750 mg 150 mL/hr over 60 Minutes Intravenous Every 24 hours 07/25/20 1208     07/25/20 1215  vancomycin (VANCOREADY) IVPB 1250 mg/250 mL        1,250 mg 166.7 mL/hr over 90 Minutes  Intravenous  Once 07/25/20 1208 07/25/20 1542       Assessment/Plan: Patient Active Problem List   Diagnosis Date Noted  . Gluteal abscess 07/25/2020  . Status post surgery 07/25/2020  . PTSD (post-traumatic stress disorder) 07/19/2018  . GAD (generalized anxiety disorder) 07/19/2018   s/p Procedure(s): IRRIGATION AND DEBRIDEMENT ABSCESS 07/25/2020  -IV infiltrated in R AC - most of blood transfusion was noted to have backed up into a bag of saline - IV team consulted -PRBC transfusion ordered for presumably acute blood loss anemia. -Mild sinus tachycarda - low 100s now; BP 100/60. 2 U PRBC pending -Cont IV abx today -Wet to dry dressings - change daily -TOC for Home health -Husband will need to be taught wound care as well - moist kerlex loosely wicked into wound tracking laterally    LOS: 1 day   Marin Olp, MD [facility] Use [email or web address] to contact on call provider

## 2020-07-26 NOTE — Progress Notes (Addendum)
Assessed PIV placed this am per IV Team consult.  BP cuff removed from RUE/same side as PIV. IV flushed, pt states only stings with initial flush, then feels ok.  Also assessed by Gasper Lloyd RN VAS Team.Husband at bedside during assessment, also verbalized understanding. No need for PIV reinsertion at this time.  Instructed Samuel RN notified no need for PIV start, and to leave BP cuff off ipsilateral side and to flush PIV slowly due to small veins.

## 2020-07-26 NOTE — Significant Event (Signed)
Rapid Response Event Note   Reason for Call :  Shortness of breath during blood transfusion reaction  Initial Focused Assessment:  Pt lying in bed, AO. Skin is warm, dry. Lung sounds are clear throughout. She endorses shortness of breath. Breathing is unlabored and pt is able to speak in full sentences without becoming short of breath. She endorses known pain to her gluteal abscess. Denies other pain.  T 99.32F, BP 104/56, HR 133, RR 16, SpO2 96% on 2LNC  Interventions:  -MD ordered Tylenol and Benadryl and to resume blood transfusion   Plan of Care:  -IV team consult for PIV - recommend removing LFA and RFA IV as pt has significant pain with flushing and irritation noted by swelling above IV sites  Event Summary:  MD Notified: Primary RN notified MD Call Time: 1815 Arrival Time: 1820 End Time: 1910  Jennye Moccasin, RN

## 2020-07-26 NOTE — Progress Notes (Signed)
Pt c/o SOB during her second  Administration. Temp was taken and a difference between the pre and that temp noticed. Blood transfusion stopped

## 2020-07-26 NOTE — TOC Progression Note (Signed)
Transition of Care [facility]) - Progression Note    Patient Details  Name: Stephanie Stokes MRN: [id number] Date of Birth: February 04, 1956  Transition of [facility]) CM/SW Contact  Leone Haven, RN Phone Number: 07/26/2020, 4:57 PM  Clinical Narrative:    NCM spoke with patient at bedside, she lives with her spouse, who she states is willing to do the daily dressing changes, he just need to be shown how to do them.  NCM informed Psychologist, counselling of this information.  NCM tried to get [facility] set up for patient, no availability.  NCM asked MD Christoper White  how does he feel about patient going to wound clinic? , NCM called to schedule apt at [facility] and the first available is 2/25.  NCM tried to call the [place] wound clinic at [phone] but they closed at 4:30.  NCM asked MD if patient could have follow up apt at the surgical center for dressing changes and to check wound, MD said he will have to find out about this in rounds he was just covering today.   NCM that is following up, please check with [place] wound clinic to see what their earliest apt date is at [phone].  Patient PCP is Dr. Tracie Harrier at [facility].        Expected Discharge Plan and Services                                                 Social Determinants of Health (SDOH) Interventions    Readmission Risk Interventions No flowsheet data found.

## 2020-07-27 LAB — CULTURE, BLOOD (ROUTINE X 2)
Culture: NO GROWTH
Culture: NO GROWTH

## 2020-07-27 LAB — TYPE AND SCREEN
ABO/RH(D): B POS
Antibody Screen: NEGATIVE
Unit division: 0
Unit division: 0
Unit division: 0

## 2020-07-27 LAB — BPAM RBC
Blood Product Expiration Date: 202202152359
Blood Product Expiration Date: 202202162359
Blood Product Expiration Date: 202202162359
ISSUE DATE / TIME: 202201240807
ISSUE DATE / TIME: 202201241150
ISSUE DATE / TIME: 202201241748
Unit Type and Rh: 7300
Unit Type and Rh: 7300
Unit Type and Rh: 7300

## 2020-07-27 LAB — CBC
HCT: 29.1 % — ABNORMAL LOW (ref 36.0–46.0)
Hemoglobin: 9.6 g/dL — ABNORMAL LOW (ref 12.0–15.0)
MCH: 25 pg — ABNORMAL LOW (ref 26.0–34.0)
MCHC: 33 g/dL (ref 30.0–36.0)
MCV: 75.8 fL — ABNORMAL LOW (ref 80.0–100.0)
Platelets: 410 10*3/uL — ABNORMAL HIGH (ref 150–400)
RBC: 3.84 MIL/uL — ABNORMAL LOW (ref 3.87–5.11)
RDW: 21 % — ABNORMAL HIGH (ref 11.5–15.5)
WBC: 13.9 10*3/uL — ABNORMAL HIGH (ref 4.0–10.5)
nRBC: 0.1 % (ref 0.0–0.2)

## 2020-07-27 LAB — GLUCOSE, CAPILLARY
Glucose-Capillary: 137 mg/dL — ABNORMAL HIGH (ref 70–99)
Glucose-Capillary: 149 mg/dL — ABNORMAL HIGH (ref 70–99)
Glucose-Capillary: 223 mg/dL — ABNORMAL HIGH (ref 70–99)
Glucose-Capillary: 259 mg/dL — ABNORMAL HIGH (ref 70–99)
Glucose-Capillary: 94 mg/dL (ref 70–99)
Glucose-Capillary: 98 mg/dL (ref 70–99)

## 2020-07-27 LAB — MAGNESIUM: Magnesium: 2.9 mg/dL — ABNORMAL HIGH (ref 1.7–2.4)

## 2020-07-27 MED ORDER — ATORVASTATIN CALCIUM 10 MG PO TABS
20.0000 mg | ORAL_TABLET | Freq: Every day | ORAL | Status: DC
  Administered 2020-07-27: 20 mg via ORAL
  Filled 2020-07-27: qty 2

## 2020-07-27 MED ORDER — SERTRALINE HCL 100 MG PO TABS
200.0000 mg | ORAL_TABLET | Freq: Every day | ORAL | Status: DC
Start: 2020-07-27 — End: 2020-07-28
  Administered 2020-07-27 – 2020-07-28 (×2): 200 mg via ORAL
  Filled 2020-07-27 (×2): qty 2

## 2020-07-27 MED ORDER — ENSURE ENLIVE PO LIQD
237.0000 mL | Freq: Two times a day (BID) | ORAL | Status: DC
  Administered 2020-07-27 – 2020-07-28 (×3): 237 mL via ORAL

## 2020-07-27 MED ORDER — HEPARIN SODIUM (PORCINE) 5000 UNIT/ML IJ SOLN
5000.0000 [IU] | Freq: Three times a day (TID) | INTRAMUSCULAR | Status: DC
  Administered 2020-07-27 – 2020-07-28 (×3): 5000 [IU] via SUBCUTANEOUS
  Filled 2020-07-27 (×3): qty 1

## 2020-07-27 NOTE — Progress Notes (Signed)
2 Days Post-Op  Subjective: Hgb up to 9.6 this morning after blood transfusion. WBC 14 (from 15). Afebrile, BP improved. Patient says her dizziness has also improved. She had significant pain with dressing change and is anxious and tearful about caring for her wound at home. Husband at bedside this morning for dressing teaching. Cultures growing Strep intermedius, sensitivities pending.   Objective: Vital signs in last 24 hours: Temp:  [97.3 F (36.3 C)-100.2 F (37.9 C)] 97.3 F (36.3 C) (01/25 0759) Pulse Rate:  [100-133] 100 (01/25 0528) Resp:  [16-24] 21 (01/25 0528) BP: (94-104)/(51-66) 103/66 (01/25 0528) SpO2:  [84 %-100 %] 99 % (01/25 0528) Weight:  [57.7 kg] 57.7 kg (01/25 0100) Last BM Date: 07/26/20  Intake/Output from previous day: 01/24 0701 - 01/25 0700 In: 2702.1 [P.O.:820; Blood:1882.1] Out: 2 [Urine:1; Stool:1] Intake/Output this shift: Total I/O In: 360 [P.O.:360] Out: -   PE: General: resting comfortably, NAD Neuro: alert and oriented, no focal deficits Resp: normal work of breathing CV: RRR L gluteal wound: Purulent drainage on packing, penrose in place, mild induration of surrounding skin.   Lab Results:  Recent Labs    07/26/20 0251 07/27/20 0103  WBC 15.0* 13.9*  HGB 5.3* 9.6*  HCT 17.2* 29.1*  PLT 394 410*   BMET Recent Labs    07/25/20 1027 07/26/20 0251  NA 129* 131*  K 4.4 4.1  CL 94* 101  CO2 18* 20*  GLUCOSE 417* 292*  BUN 16 16  CREATININE 0.79 0.92  CALCIUM 9.5 8.5*   PT/INR Recent Labs    07/25/20 1308  LABPROT 17.2*  INR 1.5*   CMP     Component Value Date/Time   NA 131 (L) 07/26/2020 0251   K 4.1 07/26/2020 0251   CL 101 07/26/2020 0251   CO2 20 (L) 07/26/2020 0251   GLUCOSE 292 (H) 07/26/2020 0251   BUN 16 07/26/2020 0251   CREATININE 0.92 07/26/2020 0251   CALCIUM 8.5 (L) 07/26/2020 0251   PROT 7.4 07/25/2020 1027   ALBUMIN 2.6 (L) 07/25/2020 1027   AST 77 (H) 07/25/2020 1027   ALT 73 (H)  07/25/2020 1027   ALKPHOS 347 (H) 07/25/2020 1027   BILITOT 1.0 07/25/2020 1027   GFRNONAA >60 07/26/2020 0251   Lipase  No results found for: LIPASE     Studies/Results: DG Chest 2 View  Result Date: 07/25/2020 CLINICAL DATA:  65 year old female with history of suspected sepsis. EXAM: CHEST - 2 VIEW COMPARISON:  No priors. FINDINGS: Lung volumes are normal. No consolidative airspace disease. No pleural effusions. No pneumothorax. No pulmonary nodule or mass noted. Pulmonary vasculature and the cardiomediastinal silhouette are within normal limits. IMPRESSION: No radiographic evidence of acute cardiopulmonary disease. Electronically Signed   By: Trudie Reed M.D.   On: 07/25/2020 11:07    Anti-infectives: Anti-infectives (From admission, onward)   Start     Dose/Rate Route Frequency Ordered Stop   07/26/20 1300  vancomycin (VANCOREADY) IVPB 750 mg/150 mL        750 mg 150 mL/hr over 60 Minutes Intravenous Every 24 hours 07/25/20 1208     07/25/20 1215  vancomycin (VANCOREADY) IVPB 1250 mg/250 mL        1,250 mg 166.7 mL/hr over 90 Minutes Intravenous  Once 07/25/20 1208 07/25/20 1542       Assessment/Plan 65 yo female POD2 s/p incision and drainage of left gluteal abscess. - Increase frequency of dressing changes to BID. Allow patient to shower at time  of dressing change. - Continue vancomycin, transition to oral antibiotics once sensitivities are available - Patient will need home health nursing, and husband will assist with home dressing changes - Regular diet, stop IV fluids - Dispo: inpatient   LOS: 2 days    Sophronia Simas, MD [facility] General, Hepatobiliary and Pancreatic Surgery 07/27/20 8:55 AM

## 2020-07-27 NOTE — Progress Notes (Signed)
Inpatient Diabetes Program Recommendations  AACE/ADA: New Consensus Statement on Inpatient Glycemic Control (2015)  Target Ranges:  Prepandial:   less than 140 mg/dL      Peak postprandial:   less than 180 mg/dL (1-2 hours)      Critically ill patients:  140 - 180 mg/dL   Lab Results  Component Value Date   GLUCAP 259 (H) 07/27/2020   HGBA1C 9.2 (H) 07/25/2020    Review of Glycemic Control Results for Stephanie Stokes, Stephanie Stokes (MRN [id number]) as of 07/27/2020 11:54  Ref. Range 07/26/2020 16:11 07/26/2020 21:20 07/27/2020 02:09 07/27/2020 06:08 07/27/2020 10:54  Glucose-Capillary Latest Ref Range: 70 - 99 mg/dL 474 (H) 259 (H) 94 98 563 (H)    Diabetes history: DM2 Outpatient Diabetes medications: Metformin 500 mg XR bid Current orders for Inpatient glycemic control: Levemir 6 units bid + Novolog 0-9 units tid + hs 0-5 units  Inpatient Diabetes Program Recommendations:    Fasting CBG 98 -Decrease Levemir to 5 units bid  Thank you, Billy Fischer. Rodrecus Belsky, RN, MSN, CDE  Diabetes Coordinator Inpatient Glycemic Control Team Team Pager [phone] (8am-5pm) 07/27/2020 11:57 AM

## 2020-07-28 LAB — GLUCOSE, CAPILLARY
Glucose-Capillary: 146 mg/dL — ABNORMAL HIGH (ref 70–99)
Glucose-Capillary: 110 mg/dL — ABNORMAL HIGH (ref 70–99)
Glucose-Capillary: 308 mg/dL — ABNORMAL HIGH (ref 70–99)

## 2020-07-28 LAB — CBC
HCT: 30 % — ABNORMAL LOW (ref 36.0–46.0)
Hemoglobin: 10.3 g/dL — ABNORMAL LOW (ref 12.0–15.0)
MCH: 25.9 pg — ABNORMAL LOW (ref 26.0–34.0)
MCHC: 34.3 g/dL (ref 30.0–36.0)
MCV: 75.6 fL — ABNORMAL LOW (ref 80.0–100.0)
Platelets: 399 10*3/uL (ref 150–400)
RBC: 3.97 MIL/uL (ref 3.87–5.11)
RDW: 21.7 % — ABNORMAL HIGH (ref 11.5–15.5)
WBC: 12 10*3/uL — ABNORMAL HIGH (ref 4.0–10.5)
nRBC: 0.5 % — ABNORMAL HIGH (ref 0.0–0.2)

## 2020-07-28 MED ORDER — LINAGLIPTIN 5 MG PO TABS: 5.0000 mg | ORAL_TABLET | Freq: Every day | ORAL | 0 refills | Status: DC

## 2020-07-28 MED ORDER — ENSURE ENLIVE PO LIQD: 237.0000 mL | Freq: Two times a day (BID) | ORAL | Status: DC

## 2020-07-28 MED ORDER — ACETAMINOPHEN 500 MG PO TABS: 1000.0000 mg | ORAL_TABLET | Freq: Four times a day (QID) | ORAL | Status: DC | PRN

## 2020-07-28 MED ORDER — AMOXICILLIN-POT CLAVULANATE 875-125 MG PO TABS: 1.0000 | ORAL_TABLET | Freq: Two times a day (BID) | ORAL | 0 refills | Status: AC

## 2020-07-28 MED ORDER — ENSURE ENLIVE PO LIQD
237.0000 mL | Freq: Two times a day (BID) | ORAL | Status: DC
  Administered 2020-07-28: 237 mL via ORAL

## 2020-07-28 MED ORDER — METFORMIN HCL ER 500 MG PO TB24: 500.0000 mg | ORAL_TABLET | Freq: Two times a day (BID) | ORAL | 0 refills | Status: AC

## 2020-07-28 MED ORDER — INSULIN ASPART 100 UNIT/ML ~~LOC~~ SOLN
0.0000 [IU] | Freq: Three times a day (TID) | SUBCUTANEOUS | Status: DC
  Administered 2020-07-28: 15 [IU] via SUBCUTANEOUS

## 2020-07-28 MED ORDER — AMOXICILLIN-POT CLAVULANATE 875-125 MG PO TABS
1.0000 | ORAL_TABLET | Freq: Two times a day (BID) | ORAL | Status: DC
  Administered 2020-07-28: 1 via ORAL
  Filled 2020-07-28: qty 1

## 2020-07-28 MED ORDER — OXYCODONE HCL 5 MG PO TABS: 5.0000 mg | ORAL_TABLET | ORAL | 0 refills | Status: DC | PRN

## 2020-07-28 NOTE — Progress Notes (Signed)
Patient alert and oriented x4 VSS. Tele and iv removed. D/c instruction exlain to patient and her husband all questions answered, dressing change supplies sent home with patient per PA request. Patient d/c home per order

## 2020-07-28 NOTE — Discharge Summary (Signed)
[facility] Discharge Summary   Patient ID: Stephanie Stokes MRN: [id number] DOB/AGE: Sep 30, 1955 65 y.o.  Admit date: 07/25/2020 Discharge date: 07/28/2020  Admitting Diagnosis: Right gluteal abscess Type II diabetes mellitus   Discharge Diagnosis Right gluteal abscess - s/p I&D Type II diabetes mellitus   Consultants Diabetes Coordinator   Imaging: No results found.  Procedures Dr. Phylliss Blakes (07/25/20) - incision and drainage  Hospital Course:  Patient is a 65 year old female who presented to [facility] with right gluteal pain and swelling. Hx of T2DM and recent fall a few weeks ago with hematoma to right gluteus. Workup showed right gluteal abscess.  Patient was admitted and underwent procedure listed above.  Tolerated procedure well and was transferred to the floor.  Diet was advanced as tolerated.  On POD#3, the patient was voiding well, tolerating diet, ambulating well, pain reasonably well controlled, vital signs stable, wound stable and felt stable for discharge home.  Patient will follow up in our office as listed below and knows to call with questions or concerns. She will call to confirm appointment date/time. Diabetes coordinator was consulted for blood glucose management with A1c of 9.2 and recommended discharge on home metformin and adding tradjenta daily. Patient instructed to take these medications as directed and follow up with her primary care provider for further management of diabetes.  Physical Exam:  General: pleasant, WD, WN female who is laying in bed in NAD HEENT: head is normocephalic, atraumatic.  Sclera are noninjected.  PERRL.  Ears and nose without any masses or lesions.  Mouth is pink and moist Heart: regular, rate, and rhythm.   Lungs: Respiratory effort nonlabored Abd: soft, NT, ND GU: L gluteal wound with some granulation tissue present and some purulent drainage, penrose drain present and minimal surrounding induration    I or a member of  my team have reviewed this patient in the Controlled Substance Database.   Allergies as of 07/28/2020      Reactions   Cephalexin Rash      Medication List    STOP taking these medications   naproxen sodium 220 MG tablet Commonly known as: ALEVE     TAKE these medications   acetaminophen 500 MG tablet Commonly known as: TYLENOL Take 2 tablets (1,000 mg total) by mouth every 6 (six) hours as needed for mild pain or fever.   amoxicillin-clavulanate 875-125 MG tablet Commonly known as: AUGMENTIN Take 1 tablet by mouth every 12 (twelve) hours for 7 days.   atorvastatin 20 MG tablet Commonly known as: LIPITOR Take 20 mg by mouth at bedtime.   diclofenac 50 MG tablet Commonly known as: CATAFLAM Take 50 mg by mouth 2 (two) times daily as needed (for pain).   linagliptin 5 MG Tabs tablet Commonly known as: Tradjenta Take 1 tablet (5 mg total) by mouth daily.   lisinopril 2.5 MG tablet Commonly known as: ZESTRIL Take 2.5 mg by mouth at bedtime.   loratadine 10 MG tablet Commonly known as: CLARITIN Take 10 mg by mouth daily as needed for allergies or rhinitis.   metFORMIN 500 MG 24 hr tablet Commonly known as: GLUCOPHAGE-XR Take 1 tablet (500 mg total) by mouth in the morning and at bedtime.   OneTouch Delica Plus Lancet33G Misc Apply 1 each topically daily.   OneTouch Verio test strip Generic drug: glucose blood 1 each daily.   oxyCODONE 5 MG immediate release tablet Commonly known as: Oxy IR/ROXICODONE Take 1-2 tablets (5-10 mg total) by mouth every 4 (  four) hours as needed for moderate pain.   sertraline 100 MG tablet Commonly known as: ZOLOFT TAKE 2 TABLETS BY MOUTH  DAILY What changed: when to take this         Follow-up Information    [facility], [facility] Follow up.   Specialty: General Surgery Contact information: [address] [place] [address] [phone]        Aliene Beams, MD. Call.   Specialty: Family  Medicine Why: Call and schedule follow up appointment for diabetes management. A1c was 9.2 on 07/26/20.  Contact information: [address] [place] [address] [phone]               Signed: Juliet Rude , [facility] Surgery 07/28/2020, 9:38 AM Please see Amion for pager number during day hours 7:00am-4:30pm

## 2020-07-28 NOTE — Plan of Care (Signed)

## 2020-07-28 NOTE — Discharge Instructions (Signed)
WOUND CARE: - dressing to be changed twice daily - or more often if able/needed - supplies: sterile saline, guaze, scissors, ABD pads/maternity pads, tape (if needed but avoid if able) - remove dressing and all packing carefully, moistening with sterile saline as needed to avoid packing/internal dressing sticking to the wound. - clean edges of skin around the wound with water/gauze, making sure there is no tape debris or leakage left on skin that could cause skin irritation or breakdown. - dampen clean gauze with sterile saline and pack wound from wound base to skin level, making sure to take note of any possible areas of wound tracking, tunneling and packing appropriately. Wound can be packed loosely. Trim gauze to size if a whole piece is not required. - cover wound with a dry ABD pad and secure with briefs or tape if needed.  - write the date/time on the dry dressing/tape to better track when the last dressing change occurred. - apply any skin protectant/powder recommended by clinician to protect skin/skin folds. - change dressing as needed if leakage occurs, wound gets contaminated, or patient requests to shower. - patient may shower daily with wound open and following the shower the wound should be dried and a clean dressing placed.

## 2020-07-28 NOTE — TOC Progression Note (Addendum)
Transition of Care [facility]) - Progression Note    Patient Details  Name: Stephanie Stokes MRN: [id number] Date of Birth: [birth year]  Transition of Care [facility]) CM/SW Contact  Reola Mosher Transition of Care Supervisor Phone Number: [phone] 07/28/2020, 9:59 AM  Clinical Narrative:     Elnita Maxwell with [facility] called for referral for [facility] and they will accept the patient for [facility] services. Talked to patient and spouse and they are in agreement with the plan. ( [facility] could not accept the patient due to staffing)  10:28 am- Received call from [place] with [facility], they cannot accept her due to staffing issues. Called [facility], unable to see her until March 1st; [place] [facility] cannot see the patient until Feb 10th. Trixie Deis PA-C updated; patient will be seen in the office next week; dressing changes/ wound care teaching has been done with the spouse.   Expected Discharge Plan: Home w Home Health Services Barriers to Discharge: No Barriers Identified  Expected Discharge Plan and Services Expected Discharge Plan: Home w Home Health Services       Living arrangements for the past 2 months: Single Family Home                             HH Agency: [facility] Date [facility] Agency Contacted: 07/28/20 Time HH Agency Contacted: [phone] Representative spoke with at [facility] Agency: Elnita Maxwell   Social Determinants of Health (SDOH) Interventions    Readmission Risk Interventions No flowsheet data found.

## 2020-07-30 LAB — AEROBIC/ANAEROBIC CULTURE W GRAM STAIN (SURGICAL/DEEP WOUND)

## 2020-07-30 LAB — CULTURE, BLOOD (ROUTINE X 2)
Special Requests: ADEQUATE
Special Requests: ADEQUATE

## 2020-08-10 ENCOUNTER — Telehealth: Payer: Self-pay | Admitting: Hematology and Oncology

## 2020-08-10 NOTE — Telephone Encounter (Signed)
Received a new hem referral from Dr. Tracie Harrier for anemia. Mrs. Stephanie Stokes has been cld and scheduled to see Dr. Bertis Ruddy on 2/11 at 12pm. Pt aware to arrive 20 minutes early.

## 2020-08-13 ENCOUNTER — Telehealth: Payer: Self-pay | Admitting: Hematology and Oncology

## 2020-08-13 ENCOUNTER — Encounter: Payer: Self-pay | Admitting: Hematology and Oncology

## 2020-08-13 ENCOUNTER — Other Ambulatory Visit: Payer: Self-pay

## 2020-08-13 ENCOUNTER — Inpatient Hospital Stay: Payer: 59 | Attending: Hematology and Oncology | Admitting: Hematology and Oncology

## 2020-08-13 ENCOUNTER — Inpatient Hospital Stay: Payer: 59

## 2020-08-13 DIAGNOSIS — D509 Iron deficiency anemia, unspecified: Secondary | ICD-10-CM | POA: Insufficient documentation

## 2020-08-13 DIAGNOSIS — D75838 Other thrombocytosis: Secondary | ICD-10-CM | POA: Diagnosis not present

## 2020-08-13 DIAGNOSIS — Z7984 Long term (current) use of oral hypoglycemic drugs: Secondary | ICD-10-CM | POA: Insufficient documentation

## 2020-08-13 DIAGNOSIS — D539 Nutritional anemia, unspecified: Secondary | ICD-10-CM

## 2020-08-13 DIAGNOSIS — E119 Type 2 diabetes mellitus without complications: Secondary | ICD-10-CM | POA: Insufficient documentation

## 2020-08-13 DIAGNOSIS — L0231 Cutaneous abscess of buttock: Secondary | ICD-10-CM | POA: Diagnosis not present

## 2020-08-13 DIAGNOSIS — Z79899 Other long term (current) drug therapy: Secondary | ICD-10-CM | POA: Diagnosis not present

## 2020-08-13 LAB — CBC WITH DIFFERENTIAL/PLATELET
Abs Immature Granulocytes: 0.03 10*3/uL (ref 0.00–0.07)
Basophils Absolute: 0.1 10*3/uL (ref 0.0–0.1)
Basophils Relative: 1 %
Eosinophils Absolute: 0.1 10*3/uL (ref 0.0–0.5)
Eosinophils Relative: 1 %
HCT: 30.7 % — ABNORMAL LOW (ref 36.0–46.0)
Hemoglobin: 10 g/dL — ABNORMAL LOW (ref 12.0–15.0)
Immature Granulocytes: 0 %
Lymphocytes Relative: 17 %
Lymphs Abs: 1.5 10*3/uL (ref 0.7–4.0)
MCH: 25.3 pg — ABNORMAL LOW (ref 26.0–34.0)
MCHC: 32.6 g/dL (ref 30.0–36.0)
MCV: 77.7 fL — ABNORMAL LOW (ref 80.0–100.0)
Monocytes Absolute: 0.9 10*3/uL (ref 0.1–1.0)
Monocytes Relative: 10 %
Neutro Abs: 6.4 10*3/uL (ref 1.7–7.7)
Neutrophils Relative %: 71 %
Platelets: 509 10*3/uL — ABNORMAL HIGH (ref 150–400)
RBC: 3.95 MIL/uL (ref 3.87–5.11)
RDW: 21.7 % — ABNORMAL HIGH (ref 11.5–15.5)
WBC: 8.9 10*3/uL (ref 4.0–10.5)
nRBC: 0 % (ref 0.0–0.2)

## 2020-08-13 LAB — COMPREHENSIVE METABOLIC PANEL
ALT: 12 U/L (ref 0–44)
AST: 18 U/L (ref 15–41)
Albumin: 3.4 g/dL — ABNORMAL LOW (ref 3.5–5.0)
Alkaline Phosphatase: 176 U/L — ABNORMAL HIGH (ref 38–126)
Anion gap: 10 (ref 5–15)
BUN: 17 mg/dL (ref 8–23)
CO2: 22 mmol/L (ref 22–32)
Calcium: 9.1 mg/dL (ref 8.9–10.3)
Chloride: 101 mmol/L (ref 98–111)
Creatinine, Ser: 0.71 mg/dL (ref 0.44–1.00)
GFR, Estimated: >60 mL/min (ref >60)
Glucose, Bld: 147 mg/dL — ABNORMAL HIGH (ref 70–99)
Potassium: 4.3 mmol/L (ref 3.5–5.1)
Sodium: 133 mmol/L — ABNORMAL LOW (ref 135–145)
Total Bilirubin: 0.3 mg/dL (ref 0.3–1.2)
Total Protein: 7.6 g/dL (ref 6.5–8.1)

## 2020-08-13 LAB — RETICULOCYTES
Immature Retic Fract: 13.3 % (ref 2.3–15.9)
RBC.: 3.98 MIL/uL (ref 3.87–5.11)
Retic Count, Absolute: 42.6 10*3/uL (ref 19.0–186.0)
Retic Ct Pct: 1.1 % (ref 0.4–3.1)

## 2020-08-13 LAB — IRON AND TIBC
Iron: 32 ug/dL — ABNORMAL LOW (ref 41–142)
Saturation Ratios: 12 % — ABNORMAL LOW (ref 21–57)
TIBC: 273 ug/dL (ref 236–444)
UIBC: 240 ug/dL (ref 120–384)

## 2020-08-13 LAB — VITAMIN B12: Vitamin B-12: 665 pg/mL (ref 180–914)

## 2020-08-13 LAB — SEDIMENTATION RATE: Sed Rate: 77 mm/hr — ABNORMAL HIGH (ref 0–22)

## 2020-08-13 LAB — FERRITIN: Ferritin: 529 ng/mL — ABNORMAL HIGH (ref 11–307)

## 2020-08-13 NOTE — Telephone Encounter (Signed)
Scheduled appts per 2/11 sch msg. Gave pt a print out of AVS.  

## 2020-08-13 NOTE — Assessment & Plan Note (Signed)
She is very frustrated over recent uncontrolled hyperglycemia I recommend she continues prescribed medicine We discussed aggressive lifestyle modification and dietary changes

## 2020-08-13 NOTE — Assessment & Plan Note (Signed)
According to the patient, her wound is slowly improving She will continue wound care as directed

## 2020-08-13 NOTE — Progress Notes (Signed)
[facility] CONSULT NOTE  Patient Care Team: Patient, [name] as PCP - General (General Practice)  ASSESSMENT & PLAN:  Deficiency anemia The most likely cause of her anemia is due to anemia chronic illness, potentially with component of iron deficiency I will order additional work-up I will set up virtual visit for next week to review test results  Type 2 diabetes mellitus without complication, without long-term current use of insulin (HCC) She is very frustrated over recent uncontrolled hyperglycemia I recommend she continues prescribed medicine We discussed aggressive lifestyle modification and dietary changes  Gluteal abscess According to the patient, her wound is slowly improving She will continue wound care as directed    Orders Placed This Encounter  Procedures  . Iron and TIBC    Standing Status:   Future    Number of Occurrences:   1    Standing Expiration Date:   08/13/2021  . Vitamin B12    Standing Status:   Future    Number of Occurrences:   1    Standing Expiration Date:   08/13/2021  . Ferritin    Standing Status:   Future    Number of Occurrences:   1    Standing Expiration Date:   08/13/2021  . CBC with Differential/Platelet    Standing Status:   Future    Number of Occurrences:   1    Standing Expiration Date:   08/13/2021  . Comprehensive metabolic panel    Standing Status:   Future    Number of Occurrences:   1    Standing Expiration Date:   08/13/2021  . Sedimentation rate    Standing Status:   Future    Number of Occurrences:   1    Standing Expiration Date:   08/13/2021  . Reticulocytes    Standing Status:   Future    Number of Occurrences:   1    Standing Expiration Date:   08/13/2021    All questions were answered. The patient knows to call the clinic with any problems, questions or concerns.  The total time spent in the appointment was 55 minutes encounter with patients including review of chart and various tests results,  discussions about plan of care and coordination of care plan  Artis Delay, MD 2/11/202212:57 PM    CHIEF COMPLAINTS/PURPOSE OF CONSULTATION:  Severe anemia  HISTORY OF PRESENTING ILLNESS:  Stephanie Stokes 65 y.o. female is here because of recent diagnosis of severe anemia I have reviewed over 40 pages of outside records and her electronic records She was found to have abnormal CBC from recent hospitalization The patient has normal baseline hemoglobin although a year ago, her iron studies show ferritin borderline at 33 The patient fell and developed gluteal abscess last year On her admission, she was noted to have profound anemia with hemoglobin of 5.3 She received several units of blood transfusion with improvement of her hemoglobin prior to discharge She saw her surgeon last week and was told that her wound is healing well  She denies recent chest pain on exertion, shortness of breath on minimal exertion, pre-syncopal episodes, or palpitations. She had not noticed any recent bleeding such as epistaxis, hematuria or hematochezia She is not on antiplatelets agents. Her last colonoscopy was approximately 5 years ago.  Polyps were removed but nothing malignant found She had no prior history or diagnosis of cancer. Her age appropriate screening programs are up-to-date. She has pica with ice craving; she eats a  variety of diet. She never donated blood  She is very tearful throughout her visit She could not understand why her blood sugar become poorly controlled recently She has been diagnosed with diabetes for over 20 years and has manage her blood sugar well over the last 17 years but her blood sugar become profoundly elevated recently She denies complication from diabetes She is also tearful because her daughter committed suicide 8 years ago She acknowledges she is undergoing tremendous stress recently  MEDICAL HISTORY:  Past Medical History:  Diagnosis Date  . Anal fissure    Dr. Loreta Ave   . Colon polyp   . Diabetes mellitus without complication (HCC)    diet and exercise controlled    SURGICAL HISTORY: Past Surgical History:  Procedure Laterality Date  . ANAL FISSURE REPAIR  10/20/10   Dr. Loreta Ave  . CESAREAN SECTION   02/15/79 & 03/31/83  . IRRIGATION AND DEBRIDEMENT ABSCESS Right 07/25/2020   Procedure: IRRIGATION AND DEBRIDEMENT ABSCESS;  Surgeon: Berna Bue, MD;  Location: [facility] OR;  Service: General;  Laterality: Right;  . PELVIC LAPAROSCOPY  02/22/79   lysis of adhesions  . TONSILLECTOMY  age 83    SOCIAL HISTORY: Social History   Socioeconomic History  . Marital status: Married    Spouse name: Not on file  . Number of children: 2  . Years of education: Not on file  . Highest education level: Not on file  Occupational History  . Occupation: Emergency planning/management officer   Tobacco Use  . Smoking status: Never Smoker  . Smokeless tobacco: Never Used  Substance and Sexual Activity  . Alcohol use: No  . Drug use: No  . Sexual activity: Not on file  Other Topics Concern  . Not on file  Social History Narrative   Daughter [name] who had been diagnosed with Bipolar disease committed suicide on her 64 rd birthday in 2013.   Social Determinants of Health   Financial Resource Strain: Not on file  Food Insecurity: Not on file  Transportation Needs: Not on file  Physical Activity: Not on file  Stress: Not on file  Social Connections: Not on file  Intimate Partner Violence: Not on file    FAMILY HISTORY: Family History  Problem Relation Age of Onset  . Diabetes Sister   . Osteoarthritis Sister   . Breast cancer Maternal Aunt   . Cancer Maternal Grandfather   . Heart failure Paternal Grandfather     ALLERGIES:  is allergic to cephalexin.  MEDICATIONS:  Current Outpatient Medications  Medication Sig Dispense Refill  . acetaminophen (TYLENOL) 500 MG tablet Take 2 tablets (1,000 mg total) by mouth every 6 (six) hours as needed for mild pain or fever.    Marland Kitchen  atorvastatin (LIPITOR) 20 MG tablet Take 20 mg by mouth at bedtime.    . diclofenac (CATAFLAM) 50 MG tablet Take 50 mg by mouth 2 (two) times daily as needed (for pain).    . Lancets (ONETOUCH DELICA PLUS LANCET33G) MISC Apply 1 each topically daily.    Marland Kitchen linagliptin (TRADJENTA) 5 MG TABS tablet Take 1 tablet (5 mg total) by mouth daily. 30 tablet 0  . lisinopril (ZESTRIL) 2.5 MG tablet Take 2.5 mg by mouth at bedtime.    Marland Kitchen loratadine (CLARITIN) 10 MG tablet Take 10 mg by mouth daily as needed for allergies or rhinitis.    . metFORMIN (GLUCOPHAGE-XR) 500 MG 24 hr tablet Take 1 tablet (500 mg total) by mouth in the morning and at  bedtime. 60 tablet 0  . ONETOUCH VERIO test strip 1 each daily.    Marland Kitchen oxyCODONE (OXY IR/ROXICODONE) 5 MG immediate release tablet Take 1-2 tablets (5-10 mg total) by mouth every 4 (four) hours as needed for moderate pain. 30 tablet 0  . sertraline (ZOLOFT) 100 MG tablet TAKE 2 TABLETS BY MOUTH  DAILY (Patient taking differently: Take 200 mg by mouth in the morning.) 180 tablet 1   No current facility-administered medications for this visit.    REVIEW OF SYSTEMS:   Constitutional: Denies fevers, chills or abnormal night sweats Eyes: Denies blurriness of vision, double vision or watery eyes Ears, nose, mouth, throat, and face: Denies mucositis or sore throat Respiratory: Denies cough, dyspnea or wheezes Cardiovascular: Denies palpitation, chest discomfort or lower extremity swelling Gastrointestinal:  Denies nausea, heartburn or change in bowel habits Skin: Denies abnormal skin rashes Lymphatics: Denies new lymphadenopathy or easy bruising Neurological:Denies numbness, tingling or new weaknesses Behavioral/Psych: Mood is stable, no new changes  All other systems were reviewed with the patient and are negative.  PHYSICAL EXAMINATION: ECOG PERFORMANCE STATUS: 1 - Symptomatic but completely ambulatory  Vitals:   08/13/20 1140  BP: 106/67  Pulse: 95  Resp: 17   Temp: 98.1 F (36.7 C)  SpO2: 100%   Filed Weights   08/13/20 1140  Weight: 118 lb 6.4 oz (53.7 kg)    GENERAL:alert, no distress and comfortable SKIN: skin color, texture, turgor are normal, no rashes or significant lesions EYES: normal, conjunctiva are pink and non-injected, sclera clear OROPHARYNX:no exudate, no erythema and lips, buccal mucosa, and tongue normal  NECK: supple, thyroid normal size, non-tender, without nodularity LYMPH:  no palpable lymphadenopathy in the cervical, axillary or inguinal LUNGS: clear to auscultation and percussion with normal breathing effort HEART: regular rate & rhythm and no murmurs and no lower extremity edema ABDOMEN:abdomen soft, non-tender and normal bowel sounds Musculoskeletal:no cyanosis of digits and no clubbing  PSYCH: alert & oriented x 3 with fluent speech NEURO: no focal motor/sensory deficits  RADIOGRAPHIC STUDIES: I have personally reviewed the radiological images as listed and agreed with the findings in the report. DG Chest 2 View  Result Date: 07/25/2020 CLINICAL DATA:  65 year old female with history of suspected sepsis. EXAM: CHEST - 2 VIEW COMPARISON:  No priors. FINDINGS: Lung volumes are normal. No consolidative airspace disease. No pleural effusions. No pneumothorax. No pulmonary nodule or mass noted. Pulmonary vasculature and the cardiomediastinal silhouette are within normal limits. IMPRESSION: No radiographic evidence of acute cardiopulmonary disease. Electronically Signed   By: Trudie Reed M.D.   On: 07/25/2020 11:07         Artis Delay, MD 08/13/20 12:59 PM

## 2020-08-13 NOTE — Assessment & Plan Note (Signed)
The most likely cause of her anemia is due to anemia chronic illness, potentially with component of iron deficiency I will order additional work-up I will set up virtual visit for next week to review test results

## 2020-08-16 ENCOUNTER — Encounter: Payer: Self-pay | Admitting: Hematology and Oncology

## 2020-08-16 ENCOUNTER — Inpatient Hospital Stay (HOSPITAL_BASED_OUTPATIENT_CLINIC_OR_DEPARTMENT_OTHER): Payer: 59 | Admitting: Hematology and Oncology

## 2020-08-16 ENCOUNTER — Other Ambulatory Visit: Payer: Self-pay

## 2020-08-16 DIAGNOSIS — D75838 Other thrombocytosis: Secondary | ICD-10-CM | POA: Diagnosis not present

## 2020-08-16 DIAGNOSIS — D539 Nutritional anemia, unspecified: Secondary | ICD-10-CM

## 2020-08-16 NOTE — Assessment & Plan Note (Signed)
The most likely cause of the elevated platelet count is reactive thrombocytosis, especially given signs of iron deficiency Observe closely for now

## 2020-08-16 NOTE — Assessment & Plan Note (Signed)
I have reviewed all test results with her The cause of her anemia is multifactorial, a component of iron deficiency along with anemia chronic illness related to her poor wound healing Further iron deficiency component, we discussed the risk and benefits of oral iron supplement versus intravenous iron replacement therapy Ultimately, the patient would like to proceed with oral iron supplement only for the next 3 months I plan to bring her back in 3 months to repeat blood work and review test results If her anemia is not improved and she is still iron deficient, will prescribe IV iron then She agrees

## 2020-08-16 NOTE — Progress Notes (Signed)
HEMATOLOGY-ONCOLOGY ELECTRONIC VISIT PROGRESS NOTE  Patient Care Team: Patient, [name] as PCP - General (General Practice)  I connected with by phone today.   ASSESSMENT & PLAN:  Deficiency anemia I have reviewed all test results with her The cause of her anemia is multifactorial, a component of iron deficiency along with anemia chronic illness related to her poor wound healing Further iron deficiency component, we discussed the risk and benefits of oral iron supplement versus intravenous iron replacement therapy Ultimately, the patient would like to proceed with oral iron supplement only for the next 3 months I plan to bring her back in 3 months to repeat blood work and review test results If her anemia is not improved and she is still iron deficient, will prescribe IV iron then She agrees  Reactive thrombocytosis The most likely cause of the elevated platelet count is reactive thrombocytosis, especially given signs of iron deficiency Observe closely for now   Orders Placed This Encounter  Procedures  . Iron and TIBC    Standing Status:   Future    Standing Expiration Date:   08/16/2021  . Ferritin    Standing Status:   Future    Standing Expiration Date:   08/16/2021  . Sedimentation rate    Standing Status:   Future    Standing Expiration Date:   08/16/2021  . CBC with Differential/Platelet    Standing Status:   Future    Standing Expiration Date:   08/16/2021    INTERVAL HISTORY: Please see below for problem oriented charting. The purpose of today's visit is to review test results from last week Since last time I saw her, she is redirecting her focus on high-protein diet She has gained some weight Her energy level is fair She denies recent bleeding  SUMMARY OF ONCOLOGIC HISTORY: Stephanie Stokes 65 y.o. female is here because of recent diagnosis of severe anemia I have reviewed over 40 pages of outside records and her electronic records She was found to have  abnormal CBC from recent hospitalization The patient has normal baseline hemoglobin although a year ago, her iron studies show ferritin borderline at 33 The patient fell and developed gluteal abscess last year On her admission, she was noted to have profound anemia with hemoglobin of 5.3 She received several units of blood transfusion with improvement of her hemoglobin prior to discharge She saw her surgeon last week and was told that her wound is healing well  She denies recent chest pain on exertion, shortness of breath on minimal exertion, pre-syncopal episodes, or palpitations. She had not noticed any recent bleeding such as epistaxis, hematuria or hematochezia She is not on antiplatelets agents. Her last colonoscopy was approximately 5 years ago.  Polyps were removed but nothing malignant found She had no prior history or diagnosis of cancer. Her age appropriate screening programs are up-to-date. She has pica with ice craving; she eats a variety of diet. She never donated blood  She is very tearful throughout her visit She could not understand why her blood sugar become poorly controlled recently She has been diagnosed with diabetes for over 20 years and has manage her blood sugar well over the last 17 years but her blood sugar become profoundly elevated recently She denies complication from diabetes She is also tearful because her daughter committed suicide 8 years ago She acknowledges she is undergoing tremendous stress recently  Repeat blood work on 08/13/2020 to confirm multifactorial anemia, component of iron deficiency and anemia chronic disease  REVIEW OF SYSTEMS:   Constitutional: Denies fevers, chills or abnormal weight loss Eyes: Denies blurriness of vision Ears, nose, mouth, throat, and face: Denies mucositis or sore throat Respiratory: Denies cough, dyspnea or wheezes Cardiovascular: Denies palpitation, chest discomfort Gastrointestinal:  Denies nausea, heartburn or  change in bowel habits Skin: Denies abnormal skin rashes Lymphatics: Denies new lymphadenopathy or easy bruising Neurological:Denies numbness, tingling or new weaknesses Behavioral/Psych: Mood is stable, no new changes  Extremities: No lower extremity edema All other systems were reviewed with the patient and are negative.  I have reviewed the past medical history, past surgical history, social history and family history with the patient and they are unchanged from previous note.  ALLERGIES:  is allergic to cephalexin.  MEDICATIONS:  Current Outpatient Medications  Medication Sig Dispense Refill  . acetaminophen (TYLENOL) 500 MG tablet Take 2 tablets (1,000 mg total) by mouth every 6 (six) hours as needed for mild pain or fever.    Marland Kitchen atorvastatin (LIPITOR) 20 MG tablet Take 20 mg by mouth at bedtime.    . diclofenac (CATAFLAM) 50 MG tablet Take 50 mg by mouth 2 (two) times daily as needed (for pain).    . Lancets (ONETOUCH DELICA PLUS LANCET33G) MISC Apply 1 each topically daily.    Marland Kitchen linagliptin (TRADJENTA) 5 MG TABS tablet Take 1 tablet (5 mg total) by mouth daily. 30 tablet 0  . lisinopril (ZESTRIL) 2.5 MG tablet Take 2.5 mg by mouth at bedtime.    Marland Kitchen loratadine (CLARITIN) 10 MG tablet Take 10 mg by mouth daily as needed for allergies or rhinitis.    . metFORMIN (GLUCOPHAGE-XR) 500 MG 24 hr tablet Take 1 tablet (500 mg total) by mouth in the morning and at bedtime. 60 tablet 0  . ONETOUCH VERIO test strip 1 each daily.    Marland Kitchen oxyCODONE (OXY IR/ROXICODONE) 5 MG immediate release tablet Take 1-2 tablets (5-10 mg total) by mouth every 4 (four) hours as needed for moderate pain. 30 tablet 0  . sertraline (ZOLOFT) 100 MG tablet TAKE 2 TABLETS BY MOUTH  DAILY (Patient taking differently: Take 200 mg by mouth in the morning.) 180 tablet 1   No current facility-administered medications for this visit.    PHYSICAL EXAMINATION: ECOG PERFORMANCE STATUS: 1 - Symptomatic but completely  ambulatory  LABORATORY DATA:  I have reviewed the data as listed CMP Latest Ref Rng & Units 08/13/2020 07/26/2020 07/25/2020  Glucose 70 - 99 mg/dL 235(T) 614(E) 315(Q)  BUN 8 - 23 mg/dL 17 16 16   Creatinine 0.44 - 1.00 mg/dL 0.08 6.76  Sodium 135 - 145 mmol/L 133(L) 131(L) 129(L)  Potassium 3.5 - 5.1 mmol/L 4.3 4.1 4.4  Chloride 98 - 111 mmol/L 101 101 94(L)  CO2 22 - 32 mmol/L 22 20(L) 18(L)  Calcium 8.9 - 10.3 mg/dL 9.1 1.95) 9.5  Total Protein 6.5 - 8.1 g/dL 7.6 - 7.4  Total Bilirubin 0.3 - 1.2 mg/dL 0.3 - 1.0  Alkaline Phos 38 - 126 U/L 176(H) - 347(H)  AST 15 - 41 U/L 18 - 77(H)  ALT 0 - 44 U/L 12 - 73(H)    Lab Results  Component Value Date   WBC 8.9 08/13/2020   HGB 10.0 (L) 08/13/2020   HCT 30.7 (L) 08/13/2020   MCV 77.7 (L) 08/13/2020   PLT 509 (H) 08/13/2020   NEUTROABS 6.4 08/13/2020     RADIOGRAPHIC STUDIES: I have personally reviewed the radiological images as listed and agreed with the findings in the report.  DG Chest 2 View  Result Date: 07/25/2020 CLINICAL DATA:  65 year old female with history of suspected sepsis. EXAM: CHEST - 2 VIEW COMPARISON:  No priors. FINDINGS: Lung volumes are normal. No consolidative airspace disease. No pleural effusions. No pneumothorax. No pulmonary nodule or mass noted. Pulmonary vasculature and the cardiomediastinal silhouette are within normal limits. IMPRESSION: No radiographic evidence of acute cardiopulmonary disease. Electronically Signed   By: Trudie Reed M.D.   On: 07/25/2020 11:07    I discussed the assessment and treatment plan with the patient. The patient was provided an opportunity to ask questions and all were answered. The patient agreed with the plan and demonstrated an understanding of the instructions. The patient was advised to call back or seek an in-person evaluation if the symptoms worsen or if the condition fails to improve as anticipated.    I spent 20 minutes for the appointment reviewing test  results, discuss management and coordination of care.  Artis Delay, MD 08/16/2020 12:42 PM

## 2020-08-17 ENCOUNTER — Telehealth: Payer: Self-pay | Admitting: Hematology and Oncology

## 2020-08-17 NOTE — Telephone Encounter (Signed)
Scheduled appt per 2/14 sch msg - mailed letter with appt date and time   

## 2020-10-27 ENCOUNTER — Other Ambulatory Visit: Payer: Self-pay

## 2020-10-27 ENCOUNTER — Encounter: Payer: Self-pay | Admitting: Psychiatry

## 2020-10-27 ENCOUNTER — Ambulatory Visit (INDEPENDENT_AMBULATORY_CARE_PROVIDER_SITE_OTHER): Payer: 59 | Admitting: Psychiatry

## 2020-10-27 DIAGNOSIS — F4310 Post-traumatic stress disorder, unspecified: Secondary | ICD-10-CM

## 2020-10-27 DIAGNOSIS — F5105 Insomnia due to other mental disorder: Secondary | ICD-10-CM

## 2020-10-27 DIAGNOSIS — F411 Generalized anxiety disorder: Secondary | ICD-10-CM | POA: Diagnosis not present

## 2020-10-27 DIAGNOSIS — F331 Major depressive disorder, recurrent, moderate: Secondary | ICD-10-CM | POA: Diagnosis not present

## 2020-10-27 MED ORDER — SERTRALINE HCL 100 MG PO TABS: 2.0000 | ORAL_TABLET | Freq: Every day | ORAL | 3 refills | Status: DC
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
## 2020-11-10 ENCOUNTER — Telehealth: Payer: Self-pay

## 2020-11-10 NOTE — Telephone Encounter (Signed)
Call back placed to pt as requested. Pt stated that she went to her PCP today and they had a difficult time "sticking me to get labs". Pt wants to know if she should delay her appt for lab work with [facility] on 11/15/20 because of today. RN informed pt that 5 days is adequate time for previous "sticks" to heal, and that [facility] phlebotomists generally do not have issues with blood collection from patients as they are used to sticking oncology patients. RN informed pt to be sure to hydrate adequately prior to lab collection. RN ensured MD Bertis Ruddy will be notified. Pt verbalized understanding.

## 2020-11-10 NOTE — Telephone Encounter (Signed)
Returned call as requested. Left VM with call back number

## 2020-11-11 NOTE — Telephone Encounter (Signed)
Stephanie Stokes,  Can you request copies of test results from her PCP? Maybe we can cancel her labs here if she has everything I need

## 2020-11-15 ENCOUNTER — Inpatient Hospital Stay: Payer: 59 | Attending: Hematology and Oncology

## 2020-11-15 ENCOUNTER — Encounter: Payer: Self-pay | Admitting: Hematology and Oncology

## 2020-11-15 ENCOUNTER — Other Ambulatory Visit: Payer: Self-pay

## 2020-11-15 ENCOUNTER — Inpatient Hospital Stay (HOSPITAL_BASED_OUTPATIENT_CLINIC_OR_DEPARTMENT_OTHER): Payer: 59 | Admitting: Hematology and Oncology

## 2020-11-15 DIAGNOSIS — Z79899 Other long term (current) drug therapy: Secondary | ICD-10-CM | POA: Diagnosis not present

## 2020-11-15 DIAGNOSIS — D509 Iron deficiency anemia, unspecified: Secondary | ICD-10-CM | POA: Diagnosis not present

## 2020-11-15 DIAGNOSIS — D539 Nutritional anemia, unspecified: Secondary | ICD-10-CM

## 2020-11-15 DIAGNOSIS — D75838 Other thrombocytosis: Secondary | ICD-10-CM | POA: Diagnosis not present

## 2020-11-15 LAB — CBC WITH DIFFERENTIAL/PLATELET
Abs Immature Granulocytes: 0.02 10*3/uL (ref 0.00–0.07)
Basophils Absolute: 0 10*3/uL (ref 0.0–0.1)
Basophils Relative: 0 %
Eosinophils Absolute: 0.1 10*3/uL (ref 0.0–0.5)
Eosinophils Relative: 1 %
HCT: 30.4 % — ABNORMAL LOW (ref 36.0–46.0)
Hemoglobin: 9.7 g/dL — ABNORMAL LOW (ref 12.0–15.0)
Immature Granulocytes: 0 %
Lymphocytes Relative: 20 %
Lymphs Abs: 1.7 10*3/uL (ref 0.7–4.0)
MCH: 25.2 pg — ABNORMAL LOW (ref 26.0–34.0)
MCHC: 31.9 g/dL (ref 30.0–36.0)
MCV: 79 fL — ABNORMAL LOW (ref 80.0–100.0)
Monocytes Absolute: 0.7 10*3/uL (ref 0.1–1.0)
Monocytes Relative: 8 %
Neutro Abs: 6.2 10*3/uL (ref 1.7–7.7)
Neutrophils Relative %: 71 %
Platelets: 412 10*3/uL — ABNORMAL HIGH (ref 150–400)
RBC: 3.85 MIL/uL — ABNORMAL LOW (ref 3.87–5.11)
RDW: 15.5 % (ref 11.5–15.5)
WBC: 8.8 10*3/uL (ref 4.0–10.5)
nRBC: 0 % (ref 0.0–0.2)

## 2020-11-15 LAB — FERRITIN: Ferritin: 349 ng/mL — ABNORMAL HIGH (ref 11–307)

## 2020-11-15 LAB — SEDIMENTATION RATE: Sed Rate: 85 mm/hr — ABNORMAL HIGH (ref 0–22)

## 2020-11-15 LAB — IRON AND TIBC
Iron: 23 ug/dL — ABNORMAL LOW (ref 41–142)
Saturation Ratios: 9 % — ABNORMAL LOW (ref 21–57)
TIBC: 266 ug/dL (ref 236–444)
UIBC: 243 ug/dL (ref 120–384)

## 2020-11-15 NOTE — Assessment & Plan Note (Signed)
She has persistent reactive thrombocytosis We discussed the elevated sedimentation rate that could cause elevated platelet count Iron deficiency can also cause elevated platelet count The patient declined treatment and follow-up

## 2020-11-15 NOTE — Assessment & Plan Note (Signed)
I have reviewed multiple test results with the patient She has persistent anemia, microcytic in nature Her reactive thrombocytosis has slightly improved There is elevated sedimentation rate which is nonspecific but could cause anemia of chronic disease picture We discussed the risk and benefits of intravenous iron infusion and she declines We discussed future follow-up and she declined future follow-up

## 2020-11-15 NOTE — Progress Notes (Signed)
HEMATOLOGY-ONCOLOGY ELECTRONIC VISIT PROGRESS NOTE  Patient Care Team: Patient, [name] (Inactive) as PCP - General (General Practice)  I connected with the patient via telephone call  ASSESSMENT & PLAN:  Deficiency anemia I have reviewed multiple test results with the patient She has persistent anemia, microcytic in nature Her reactive thrombocytosis has slightly improved There is elevated sedimentation rate which is nonspecific but could cause anemia of chronic disease picture We discussed the risk and benefits of intravenous iron infusion and she declines We discussed future follow-up and she declined future follow-up  Reactive thrombocytosis She has persistent reactive thrombocytosis We discussed the elevated sedimentation rate that could cause elevated platelet count Iron deficiency can also cause elevated platelet count The patient declined treatment and follow-up   No orders of the defined types were placed in this encounter.   INTERVAL HISTORY: Please see below for problem oriented charting. The purpose of today's visit is to follow-up on her history of iron deficiency anemia and reactive thrombocytosis Per prior discussion, she came in this morning for blood work and the telephone visit is to review test results She feels fine Her energy level has improved and she started to exercise She denies signs or symptoms of anemia The patient denies any recent signs or symptoms of bleeding such as spontaneous epistaxis, hematuria or hematochezia. She denies recent infection She started to integrate more meat in her diet  SUMMARY OF HEMATOLOGIC HISTORY: Stephanie Stokes 65 y.o. female is here because of recent diagnosis of severe anemia I have reviewed over 40 pages of outside records and her electronic records She was found to have abnormal CBC from recent hospitalization The patient has normal baseline hemoglobin although a year ago, her iron studies show ferritin  borderline at 33 The patient fell and developed gluteal abscess last year On her admission, she was noted to have profound anemia with hemoglobin of 5.3 She received several units of blood transfusion with improvement of her hemoglobin prior to discharge She saw her surgeon last week and was told that her wound is healing well  She denies recent chest pain on exertion, shortness of breath on minimal exertion, pre-syncopal episodes, or palpitations. She had not noticed any recent bleeding such as epistaxis, hematuria or hematochezia She is not on antiplatelets agents. Her last colonoscopy was approximately 5 years ago.  Polyps were removed but nothing malignant found She had no prior history or diagnosis of cancer. Her age appropriate screening programs are up-to-date. She has pica with ice craving; she eats a variety of diet. She never donated blood  She is very tearful throughout her visit She could not understand why her blood sugar become poorly controlled recently She has been diagnosed with diabetes for over 20 years and has manage her blood sugar well over the last 17 years but her blood sugar become profoundly elevated recently She denies complication from diabetes She is also tearful because her daughter committed suicide 8 years ago She acknowledges she is undergoing tremendous stress recently  Repeat blood work on 08/13/2020 to confirm multifactorial anemia, component of iron deficiency and anemia chronic disease.  She was observed  REVIEW OF SYSTEMS:   Constitutional: Denies fevers, chills or abnormal weight loss Eyes: Denies blurriness of vision Ears, nose, mouth, throat, and face: Denies mucositis or sore throat Respiratory: Denies cough, dyspnea or wheezes Cardiovascular: Denies palpitation, chest discomfort Gastrointestinal:  Denies nausea, heartburn or change in bowel habits Skin: Denies abnormal skin rashes Lymphatics: Denies new lymphadenopathy or easy  bruising Neurological:Denies numbness, tingling or new weaknesses Behavioral/Psych: Mood is stable, no new changes  Extremities: No lower extremity edema All other systems were reviewed with the patient and are negative.  I have reviewed the past medical history, past surgical history, social history and family history with the patient and they are unchanged from previous note.  ALLERGIES:  is allergic to cephalexin.  MEDICATIONS:  Current Outpatient Medications  Medication Sig Dispense Refill  . acetaminophen (TYLENOL) 500 MG tablet Take 2 tablets (1,000 mg total) by mouth every 6 (six) hours as needed for mild pain or fever.    Marland Kitchen atorvastatin (LIPITOR) 20 MG tablet Take 20 mg by mouth at bedtime.    . diclofenac (CATAFLAM) 50 MG tablet Take 50 mg by mouth 2 (two) times daily as needed (for pain). (Patient not taking: Reported on 10/27/2020)    . Lancets (ONETOUCH DELICA PLUS LANCET33G) MISC Apply 1 each topically daily.    Marland Kitchen linagliptin (TRADJENTA) 5 MG TABS tablet Take 1 tablet (5 mg total) by mouth daily. (Patient not taking: Reported on 10/27/2020) 30 tablet 0  . lisinopril (ZESTRIL) 2.5 MG tablet Take 2.5 mg by mouth at bedtime.    Marland Kitchen loratadine (CLARITIN) 10 MG tablet Take 10 mg by mouth daily as needed for allergies or rhinitis.    . metFORMIN (GLUCOPHAGE-XR) 500 MG 24 hr tablet Take 1 tablet (500 mg total) by mouth in the morning and at bedtime. 60 tablet 0  . ONETOUCH VERIO test strip 1 each daily.    Marland Kitchen oxyCODONE (OXY IR/ROXICODONE) 5 MG immediate release tablet Take 1-2 tablets (5-10 mg total) by mouth every 4 (four) hours as needed for moderate pain. (Patient not taking: Reported on 10/27/2020) 30 tablet 0  . sertraline (ZOLOFT) 100 MG tablet Take 2 tablets (200 mg total) by mouth daily. 180 tablet 3   No current facility-administered medications for this visit.    PHYSICAL EXAMINATION: ECOG PERFORMANCE STATUS: 0 - Asymptomatic  LABORATORY DATA:  I have reviewed the data as  listed CMP Latest Ref Rng & Units 08/13/2020 07/26/2020 07/25/2020  Glucose 70 - 99 mg/dL 637(C) 588(F) 027(X)  BUN 8 - 23 mg/dL 17 16 16   Creatinine 0.44 - 1.00 mg/dL 4.12 8.78  Sodium 135 - 145 mmol/L 133(L) 131(L) 129(L)  Potassium 3.5 - 5.1 mmol/L 4.3 4.1 4.4  Chloride 98 - 111 mmol/L 101 101 94(L)  CO2 22 - 32 mmol/L 22 20(L) 18(L)  Calcium 8.9 - 10.3 mg/dL 9.1 6.76) 9.5  Total Protein 6.5 - 8.1 g/dL 7.6 - 7.4  Total Bilirubin 0.3 - 1.2 mg/dL 0.3 - 1.0  Alkaline Phos 38 - 126 U/L 176(H) - 347(H)  AST 15 - 41 U/L 18 - 77(H)  ALT 0 - 44 U/L 12 - 73(H)    Lab Results  Component Value Date   WBC 8.8 11/15/2020   HGB 9.7 (L) 11/15/2020   HCT 30.4 (L) 11/15/2020   MCV 79.0 (L) 11/15/2020   PLT 412 (H) 11/15/2020   NEUTROABS 6.2 11/15/2020    I discussed the assessment and treatment plan with the patient. The patient was provided an opportunity to ask questions and all were answered. The patient agreed with the plan and demonstrated an understanding of the instructions. The patient was advised to call back or seek an in-person evaluation if the symptoms worsen or if the condition fails to improve as anticipated.    I spent 20 minutes for the appointment reviewing test results, discuss management  and coordination of care.  Artis Delay, MD 11/15/2020 1:55 PM

## 2020-11-16 ENCOUNTER — Telehealth: Payer: Self-pay

## 2020-11-16 NOTE — Telephone Encounter (Signed)
RN returned call.  Voicemail left for call back.  

## 2020-11-17 ENCOUNTER — Telehealth: Payer: Self-pay

## 2020-11-17 ENCOUNTER — Telehealth: Payer: Self-pay | Admitting: Hematology and Oncology

## 2020-11-17 NOTE — Telephone Encounter (Signed)
Scheduled appt per 5/18 sch msg. Pt aware.  

## 2020-11-17 NOTE — Telephone Encounter (Signed)
If she would prefer to have face to face visit We can schedule return visit tomorrow or next week I am not sure calling her would help since she is not retaining information well with virtual visit

## 2020-11-17 NOTE — Telephone Encounter (Signed)
She actually said she prefers phone visit as being in the office was overwhelming for her. I offered her an in-office visit but she states she would rather talk on the phone in the comfort of her home.

## 2020-11-17 NOTE — Telephone Encounter (Signed)
Pt called stating she would like to have a phone visit with Dr Bertis Ruddy regarding her results from labs 5/16, as she was "extremely overwhelmed" during her visit and did not understand anything that was told to her or recommended to her by Dr Bertis Ruddy. Pt became tearful on the phone stating she was, "out of her element" while in office and was not understanding what was told, or able to retain the information. Please advise how to proceed.

## 2020-11-18 ENCOUNTER — Encounter: Payer: Self-pay | Admitting: Hematology and Oncology

## 2020-11-18 ENCOUNTER — Inpatient Hospital Stay (HOSPITAL_BASED_OUTPATIENT_CLINIC_OR_DEPARTMENT_OTHER): Payer: 59 | Admitting: Hematology and Oncology

## 2020-11-18 DIAGNOSIS — D539 Nutritional anemia, unspecified: Secondary | ICD-10-CM | POA: Diagnosis not present

## 2020-11-18 NOTE — Assessment & Plan Note (Signed)
I reviewed all the test results and recommendations with the patient again The patient repeatedly stated she is confused After talking to her for more than 15 minutes, what I believe that she meant by confusion means she have difficulties understanding According to the patient, she felt that by integrating meat back to her diet as well as higher protein intake and doing everything possible in her power to bring up her hemoglobin, her blood count should return back to normal I explained to the patient, despite her best effort, in some situations, she could still remain anemic Doing her best effort while it is a good thing, does not guarantee the outcome or results that we desired Due to lack of symptom, I explained to her in simple languages, she does not need transfusion support or intravenous iron infusion However, if she becomes symptomatic again in the future, such as chest pain, shortness of breath, excessive fatigue or dizziness, she might benefit from rapid medical intervention with intravenous iron infusion After a lot of discussion, the patient finally understood the plan of care She was somewhat upset that she had to have 4 attempts to get her blood drawn and that was the reason why she stated she did not want to return here After much discussion and advise for her to drink more liquids and perform regular arm exercises, she is in agreement to return here in 3 months for blood work I recommend in person visit so that we can communicate better but due to her anxiety, she prefers virtual visit

## 2020-11-18 NOTE — Progress Notes (Signed)
HEMATOLOGY-ONCOLOGY ELECTRONIC VISIT PROGRESS NOTE  Patient Care Team: Patient, [name] (Inactive) as PCP - General (General Practice)  I connected with  the patient by telephone visit to review test results and recommendations.  The whole visit was conducted through telephone call  ASSESSMENT & PLAN:  Deficiency anemia I reviewed all the test results and recommendations with the patient again The patient repeatedly stated she is confused After talking to her for more than 15 minutes, what I believe that she meant by confusion means she have difficulties understanding According to the patient, she felt that by integrating meat back to her diet as well as higher protein intake and doing everything possible in her power to bring up her hemoglobin, her blood count should return back to normal I explained to the patient, despite her best effort, in some situations, she could still remain anemic Doing her best effort while it is a good thing, does not guarantee the outcome or results that we desired Due to lack of symptom, I explained to her in simple languages, she does not need transfusion support or intravenous iron infusion However, if she becomes symptomatic again in the future, such as chest pain, shortness of breath, excessive fatigue or dizziness, she might benefit from rapid medical intervention with intravenous iron infusion After a lot of discussion, the patient finally understood the plan of care She was somewhat upset that she had to have 4 attempts to get her blood drawn and that was the reason why she stated she did not want to return here After much discussion and advise for her to drink more liquids and perform regular arm exercises, she is in agreement to return here in 3 months for blood work I recommend in person visit so that we can communicate better but due to her anxiety, she prefers virtual visit   Orders Placed This Encounter  Procedures  . Iron and TIBC     Standing Status:   Future    Standing Expiration Date:   11/18/2021  . Ferritin    Standing Status:   Future    Standing Expiration Date:   11/18/2021  . CBC with Differential/Platelet    Standing Status:   Future    Standing Expiration Date:   11/18/2021  . Reticulocytes    Standing Status:   Future    Standing Expiration Date:   11/18/2021    INTERVAL HISTORY: Please see below for problem oriented charting. This appointment was made because after our recent virtual visit, the patient did not recall the whole conversation and repeatedly stated she is confused After a 15-minute conversation, what the patient meant by confusion was she have difficulties understanding the gist of her discussion In her opinion, she had try everything possible to bring up her hemoglobin including integrating meat in her diet She does not understand why her blood count was not getting better She felt fine overall with good energy and able to exercise  SUMMARY OF HEMATOLOGIC HISTORY: Stephanie Stokes 65 y.o. female is here because of recent diagnosis of severe anemia I have reviewed over 40 pages of outside records and her electronic records She was found to have abnormal CBC from recent hospitalization The patient has normal baseline hemoglobin although a year ago, her iron studies show ferritin borderline at 33 The patient fell and developed gluteal abscess last year On her admission, she was noted to have profound anemia with hemoglobin of 5.3 She received several units of blood transfusion with improvement of  her hemoglobin prior to discharge She saw her surgeon last week and was told that her wound is healing well  She denies recent chest pain on exertion, shortness of breath on minimal exertion, pre-syncopal episodes, or palpitations. She had not noticed any recent bleeding such as epistaxis, hematuria or hematochezia She is not on antiplatelets agents. Her last colonoscopy was approximately 5 years ago.   Polyps were removed but nothing malignant found She had no prior history or diagnosis of cancer. Her age appropriate screening programs are up-to-date. She has pica with ice craving; she eats a variety of diet. She never donated blood  She is very tearful throughout her visit She could not understand why her blood sugar become poorly controlled recently She has been diagnosed with diabetes for over 20 years and has manage her blood sugar well over the last 17 years but her blood sugar become profoundly elevated recently She denies complication from diabetes She is also tearful because her daughter committed suicide 8 years ago She acknowledges she is undergoing tremendous stress recently  Repeat blood work on 08/13/2020 to confirm multifactorial anemia, component of iron deficiency and anemia chronic disease.  She was observed  REVIEW OF SYSTEMS:   Constitutional: Denies fevers, chills or abnormal weight loss Eyes: Denies blurriness of vision Ears, nose, mouth, throat, and face: Denies mucositis or sore throat Respiratory: Denies cough, dyspnea or wheezes Cardiovascular: Denies palpitation, chest discomfort Gastrointestinal:  Denies nausea, heartburn or change in bowel habits Skin: Denies abnormal skin rashes Lymphatics: Denies new lymphadenopathy or easy bruising Neurological:Denies numbness, tingling or new weaknesses Behavioral/Psych: Mood is stable, no new changes  Extremities: No lower extremity edema All other systems were reviewed with the patient and are negative.  I have reviewed the past medical history, past surgical history, social history and family history with the patient and they are unchanged from previous note.  ALLERGIES:  is allergic to cephalexin.  MEDICATIONS:  Current Outpatient Medications  Medication Sig Dispense Refill  . acetaminophen (TYLENOL) 500 MG tablet Take 2 tablets (1,000 mg total) by mouth every 6 (six) hours as needed for mild pain or fever.    Marland Kitchen  atorvastatin (LIPITOR) 20 MG tablet Take 20 mg by mouth at bedtime.    . diclofenac (CATAFLAM) 50 MG tablet Take 50 mg by mouth 2 (two) times daily as needed (for pain). (Patient not taking: Reported on 10/27/2020)    . Lancets (ONETOUCH DELICA PLUS LANCET33G) MISC Apply 1 each topically daily.    Marland Kitchen linagliptin (TRADJENTA) 5 MG TABS tablet Take 1 tablet (5 mg total) by mouth daily. (Patient not taking: Reported on 10/27/2020) 30 tablet 0  . lisinopril (ZESTRIL) 2.5 MG tablet Take 2.5 mg by mouth at bedtime.    Marland Kitchen loratadine (CLARITIN) 10 MG tablet Take 10 mg by mouth daily as needed for allergies or rhinitis.    . metFORMIN (GLUCOPHAGE-XR) 500 MG 24 hr tablet Take 1 tablet (500 mg total) by mouth in the morning and at bedtime. 60 tablet 0  . ONETOUCH VERIO test strip 1 each daily.    Marland Kitchen oxyCODONE (OXY IR/ROXICODONE) 5 MG immediate release tablet Take 1-2 tablets (5-10 mg total) by mouth every 4 (four) hours as needed for moderate pain. (Patient not taking: Reported on 10/27/2020) 30 tablet 0  . sertraline (ZOLOFT) 100 MG tablet Take 2 tablets (200 mg total) by mouth daily. 180 tablet 3   No current facility-administered medications for this visit.    PHYSICAL EXAMINATION: ECOG PERFORMANCE STATUS:  0 - Asymptomatic  LABORATORY DATA:  I have reviewed the data as listed CMP Latest Ref Rng & Units 08/13/2020 07/26/2020 07/25/2020  Glucose 70 - 99 mg/dL 536(U) 440(H) 474(Q)  BUN 8 - 23 mg/dL 17 16 16   Creatinine 0.44 - 1.00 mg/dL 5.95 6.38  Sodium 135 - 145 mmol/L 133(L) 131(L) 129(L)  Potassium 3.5 - 5.1 mmol/L 4.3 4.1 4.4  Chloride 98 - 111 mmol/L 101 101 94(L)  CO2 22 - 32 mmol/L 22 20(L) 18(L)  Calcium 8.9 - 10.3 mg/dL 9.1 7.56) 9.5  Total Protein 6.5 - 8.1 g/dL 7.6 - 7.4  Total Bilirubin 0.3 - 1.2 mg/dL 0.3 - 1.0  Alkaline Phos 38 - 126 U/L 176(H) - 347(H)  AST 15 - 41 U/L 18 - 77(H)  ALT 0 - 44 U/L 12 - 73(H)    Lab Results  Component Value Date   WBC 8.8 11/15/2020   HGB 9.7 (L)  11/15/2020   HCT 30.4 (L) 11/15/2020   MCV 79.0 (L) 11/15/2020   PLT 412 (H) 11/15/2020   NEUTROABS 6.2 11/15/2020    I discussed the assessment and treatment plan with the patient. The patient was provided an opportunity to ask questions and all were answered. The patient agreed with the plan and demonstrated an understanding of the instructions. The patient was advised to call back or seek an in-person evaluation if the symptoms worsen or if the condition fails to improve as anticipated.    I spent 20 minutes for the appointment reviewing test results, discuss management and coordination of care.  11/17/2020, MD 11/18/2020 11:46 AM

## 2020-11-22 ENCOUNTER — Telehealth: Payer: Self-pay | Admitting: Hematology and Oncology

## 2020-11-22 NOTE — Telephone Encounter (Signed)
Scheduled per 5/19 sch msg. Called pt and left a msg

## 2020-12-16 ENCOUNTER — Other Ambulatory Visit (HOSPITAL_COMMUNITY): Payer: Self-pay | Admitting: Student

## 2020-12-16 ENCOUNTER — Other Ambulatory Visit (HOSPITAL_BASED_OUTPATIENT_CLINIC_OR_DEPARTMENT_OTHER): Payer: Self-pay | Admitting: Student

## 2020-12-16 DIAGNOSIS — L0231 Cutaneous abscess of buttock: Secondary | ICD-10-CM

## 2020-12-17 ENCOUNTER — Other Ambulatory Visit: Payer: Self-pay

## 2020-12-17 ENCOUNTER — Encounter (HOSPITAL_BASED_OUTPATIENT_CLINIC_OR_DEPARTMENT_OTHER): Payer: Self-pay

## 2020-12-17 ENCOUNTER — Ambulatory Visit (HOSPITAL_BASED_OUTPATIENT_CLINIC_OR_DEPARTMENT_OTHER)
Admission: RE | Admit: 2020-12-17 | Discharge: 2020-12-17 | Disposition: A | Discharge status: Home / Self Care | Payer: Medicare Other | Source: Ambulatory Visit | Attending: Student | Admitting: Student

## 2020-12-17 DIAGNOSIS — L0231 Cutaneous abscess of buttock: Secondary | ICD-10-CM | POA: Diagnosis not present

## 2020-12-17 LAB — POCT I-STAT CREATININE: Creatinine, Ser: 0.6 mg/dL (ref 0.44–1.00)

## 2020-12-17 MED ORDER — IOHEXOL 300 MG/ML  SOLN
75.0000 mL | Freq: Once | INTRAMUSCULAR | Status: AC | PRN
  Administered 2020-12-17: 75 mL via INTRAVENOUS

## 2020-12-27 ENCOUNTER — Telehealth: Payer: Self-pay

## 2020-12-27 NOTE — Telephone Encounter (Signed)
Patient has several concerns and is very confused as to why her surgeon sent her to ID. I advised her that she can keep appt or call surgeon back and ask to try another med as this is what she is more concerned with. She is going to keep appt tomorrow.

## 2020-12-28 ENCOUNTER — Ambulatory Visit (INDEPENDENT_AMBULATORY_CARE_PROVIDER_SITE_OTHER): Payer: Medicare Other | Admitting: Infectious Diseases

## 2020-12-28 ENCOUNTER — Other Ambulatory Visit: Payer: Self-pay

## 2020-12-28 VITALS — BP 124/73 | HR 85 | Temp 98.4°F | Wt 124.8 lb

## 2020-12-28 DIAGNOSIS — E119 Type 2 diabetes mellitus without complications: Secondary | ICD-10-CM | POA: Diagnosis not present

## 2020-12-28 DIAGNOSIS — L0231 Cutaneous abscess of buttock: Secondary | ICD-10-CM | POA: Diagnosis not present

## 2020-12-28 DIAGNOSIS — M4628 Osteomyelitis of vertebra, sacral and sacrococcygeal region: Secondary | ICD-10-CM

## 2020-12-28 DIAGNOSIS — Z5181 Encounter for therapeutic drug level monitoring: Secondary | ICD-10-CM | POA: Diagnosis not present

## 2020-12-28 MED ORDER — LEVOFLOXACIN 750 MG PO TABS: 750.0000 mg | ORAL_TABLET | Freq: Every day | ORAL | 0 refills | Status: DC

## 2020-12-28 MED ORDER — METRONIDAZOLE 500 MG PO TABS: 500.0000 mg | ORAL_TABLET | Freq: Two times a day (BID) | ORAL | 0 refills | Status: AC

## 2020-12-28 NOTE — Progress Notes (Signed)
[facility] Infect[facility]                                                             [address], [place], [place], [address]                                                                  Phn. [phone]; Fax: [phone]                                                                             Date: 12/28/20  Reason for Referral: RT buttock abscess  Assessment 65 Y O female with PMH of Poorly controlled DM/PTSD, MDD, GAD who is referred from General surgery for rt buttock abscess with abnormal CT abd/pelvis findings.  Problem List Items Addressed This Visit       Endocrine   Type 2 diabetes mellitus without complication, without long-term current use of insulin (HCC)     Musculoskeletal and Integument   Sacral osteomyelitis (HCC)   Relevant Medications   metroNIDAZOLE (FLAGYL) 500 MG tablet     Other   Abscess of buttock, right - Primary   Relevant Orders   MR PELVIS W WO CONTRAST   MR Lumbar Spine W Wo Contrast   CBC with Differential/Platelet (Completed)   Basic metabolic panel (Completed)   Sedimentation rate (Completed)   C-reactive protein (Completed)   IR Radiologist Eval & Mgmt   Medication monitoring encounter   Chronic Osteomyelitis RT Sacrum/Rt posterior iliac bone, possible abscess/Rt gluteal mass with multiple sinus tracts to skin  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 IR consult for possible aspiration of the presacral phlegmon. Will need sample for diagnostic purpose and if possible therapeutic purpose as well. Will need gram stain, aerobic and anaerobic cx, fungal stain and cx, AFB stain and cx + Path   I will prescribe her levofloxacin and metronidazole while getting  above done Emphasized on BG control  I will follow up in a week Baseline labs today   All questions and concerns were discussed and addressed. Patient verbalized understanding of the plan. ____________________________________________________________________________________________________________________  HPI: 21 Y O female with PMH of Poorly controlled DM/PTSD, MDD, GAD who is referred from General surgery for rt buttock abscess with abnormal CT abd/pelvis findings. History from chart review as well as patient and husband.   Patient says she fell down in a bath tub in December 2021 and hit her rt side of her body including her rt hip which turned into a hematoma of her rt buttock area. Patient was  admitted in January 2022 where she had I and D of rt gluteal abscess. 450 cc pus was evacuated. Cultures grew Streptococcus intermedius and porphyromonas spp. She was discharged on Augmentin for 7 days. She was seen by surgery in April 2022 at which time the wound was thought to be healing well with no signs of infection. Marland Kitchen However, in May, she started developing, there was a new area of pinkness and swelling near the wound. She was seen by Surgery and there was a firmness in the lateral wounds and several open areas in the medial wound. She was prescribed Doxycyline for a week and symptoms improved. She was again seen by Dr Fredricka Bonine early June where there was no signs of infection. However, mid June, she started having she developed a new area of redness in between the two wounds on the rt buttock. She was seen in Surgery office on 6/16 where that area was very tender. An attempt for I and D was done at the office with yellow fluid coming from the lateral wound , no deep tunneling. CT abdomen/pelvis done with following findings.  ROS: Positive for fatigue Constitutional: Negative for fever, chills, activity change, appetite change, fatigue and unexpected weight change.  Respiratory: Negative for cough,  shortness of breath Cardiovascular: Negative for chest pain, palpitations and leg swelling.  Gastrointestinal: Negative for nausea, vomiting, abdominal pain, diarrhea/constipation, .  Genitourinary: Negative for dysuria, hematuria, flank pain Musculoskeletal: Negative for myalgias, arthralgia, back pain, joint swelling, arthralgias Skin: Negative for rashes, lesions  Neurological: Negative for weakness, dizziness or headache   Past Medical History:  Diagnosis Date   Anal fissure    Dr. Loreta Ave   Colon polyp    Diabetes mellitus without complication (HCC)    diet and exercise controlled   Past Surgical History:  Procedure Laterality Date   ANAL FISSURE REPAIR  10/20/10   Dr. Loreta Ave   CESAREAN SECTION   02/15/79 & 03/31/83   IRRIGATION AND DEBRIDEMENT ABSCESS Right 07/25/2020   Procedure: IRRIGATION AND DEBRIDEMENT ABSCESS;  Surgeon: Berna Bue, MD;  Location: [facility] OR;  Service: General;  Laterality: Right;   PELVIC LAPAROSCOPY  02/22/79   lysis of adhesions   TONSILLECTOMY  age 68   Current Outpatient Medications on File Prior to Visit  Medication Sig Dispense Refill   acetaminophen (TYLENOL) 500 MG tablet Take 2 tablets (1,000 mg total) by mouth every 6 (six) hours as needed for mild pain or fever.     atorvastatin (LIPITOR) 20 MG tablet Take 20 mg by mouth at bedtime.     diclofenac (CATAFLAM) 50 MG tablet Take 50 mg by mouth 2 (two) times daily as needed (for pain). (Patient not taking: Reported on 10/27/2020)     Lancets (ONETOUCH DELICA PLUS LANCET33G) MISC Apply 1 each topically daily.     linagliptin (TRADJENTA) 5 MG TABS tablet Take 1 tablet (5 mg total) by mouth daily. (Patient not taking: Reported on 10/27/2020) 30 tablet 0   lisinopril (ZESTRIL) 2.5 MG tablet Take 2.5 mg by mouth at bedtime.     loratadine (CLARITIN) 10 MG tablet Take 10 mg by mouth daily as needed for allergies or rhinitis.     metFORMIN (GLUCOPHAGE-XR) 500 MG 24 hr tablet Take 1 tablet (500 mg total) by mouth  in the morning and at bedtime. 60 tablet 0   ONETOUCH VERIO test strip 1 each daily.     oxyCODONE (OXY IR/ROXICODONE) 5 MG immediate release tablet Take 1-2 tablets (5-10 mg total)  by mouth every 4 (four) hours as needed for moderate pain. (Patient not taking: Reported on 10/27/2020) 30 tablet 0   sertraline (ZOLOFT) 100 MG tablet Take 2 tablets (200 mg total) by mouth daily. 180 tablet 3   No current facility-administered medications on file prior to visit.   Allergies  Allergen Reactions   Cephalexin Rash   Social History   Socioeconomic History   Marital status: Married    Spouse name: Not on file   Number of children: 2   Years of education: Not on file   Highest education level: Not on file  Occupational History   Occupation: Emergency planning/management officer   Tobacco Use   Smoking status: Never   Smokeless tobacco: Never  Substance and Sexual Activity   Alcohol use: No   Drug use: No   Sexual activity: Not on file  Other Topics Concern   Not on file  Social History Narrative   Daughter Hospital doctor who had been diagnosed with Bipolar disease committed suicide on her 28 rd birthday in 2013.   Social Determinants of Health   Financial Resource Strain: Not on file  Food Insecurity: Not on file  Transportation Needs: Not on file  Physical Activity: Not on file  Stress: Not on file  Social Connections: Not on file  Intimate Partner Violence: Not on file   Vitals BP 124/73   Pulse 85   Temp 98.4 F (36.9 C) (Oral)   Wt 124 lb 12.8 oz (56.6 kg)   BMI 25.21 kg/m    Examination  General - not in acute distress, comfortably sitting in chair, at times TEARFUL due to her persistent rt buttock abscess HEENT - no pallor and no icterus Chest - b/l clear air entry, no additional sounds CVS- Normal s1s2, RRR Abdomen - Soft, Non tender , non distended Ext- no pedal edema Neuro: grossly normal Back -         Psych : calm and cooperative   Recent labs CBC Latest Ref Rng & Units  11/15/2020 08/13/2020 07/28/2020  WBC 4.0 - 10.5 K/uL 8.8 8.9 12.0(H)  Hemoglobin 12.0 - 15.0 g/dL 1.6(X) 10.0(L) 10.3(L)  Hematocrit 36.0 - 46.0 % 30.4(L) 30.7(L) 30.0(L)  Platelets 150 - 400 K/uL 412(H) 509(H) 399   CMP Latest Ref Rng & Units 12/17/2020 08/13/2020 07/26/2020  Glucose 70 - 99 mg/dL - 096(E) 454(U)  BUN 8 - 23 mg/dL - 17 16  Creatinine 9.81 - 1.00 mg/dL 1.91 4.78 2.95  Sodium 135 - 145 mmol/L - 133(L) 131(L)  Potassium 3.5 - 5.1 mmol/L - 4.3 4.1  Chloride 98 - 111 mmol/L - 101 101  CO2 22 - 32 mmol/L - 22 20(L)  Calcium 8.9 - 10.3 mg/dL - 9.1 6.2(Z)  Total Protein 6.5 - 8.1 g/dL - 7.6 -  Total Bilirubin 0.3 - 1.2 mg/dL - 0.3 -  Alkaline Phos 38 - 126 U/L - 176(H) -  AST 15 - 41 U/L - 18 -  ALT 0 - 44 U/L - 12 -     Pertinent Microbiology Results for orders placed or performed during the hospital encounter of 07/25/20  Culture, blood (Routine x 2)     Status: None   Collection Time: 07/25/20 10:27 AM   Specimen: BLOOD  Result Value Ref Range Status   Specimen Description BLOOD LEFT ANTECUBITAL  Final   Special Requests   Final    BOTTLES DRAWN AEROBIC AND ANAEROBIC Blood Culture adequate volume   Culture   Final  NO GROWTH 5 DAYS Performed at [facility], [address] [place], [place] [address]    Report Status 07/30/2020 FINAL  Final  Culture, blood (Routine x 2)     Status: None   Collection Time: 07/25/20  1:08 PM   Specimen: BLOOD  Result Value Ref Range Status   Specimen Description BLOOD SITE NOT SPECIFIED  Final   Special Requests   Final    BOTTLES DRAWN AEROBIC AND ANAEROBIC Blood Culture adequate volume   Culture   Final    NO GROWTH 5 DAYS Performed at [facility], [address] [place], [place] [address]    Report Status 07/30/2020 FINAL  Final  SARS Coronavirus 2 by RT PCR (hospital order, performed in [facility] hospital lab) Nasopharyngeal Nasopharyngeal Swab     Status: None   Collection Time: 07/25/20  1:08 PM    Specimen: Nasopharyngeal Swab  Result Value Ref Range Status   SARS Coronavirus 2 NEGATIVE NEGATIVE Final    Comment: (NOTE) SARS-CoV-2 target nucleic acids are NOT DETECTED.  The SARS-CoV-2 RNA is generally detectable in upper and lower respiratory specimens during the acute phase of infection. The lowest concentration of SARS-CoV-2 viral copies this assay can detect is 250 copies / mL. A negative result does not preclude SARS-CoV-2 infection and should not be used as the sole basis for treatment or other patient management decisions.  A negative result may occur with improper specimen collection / handling, submission of specimen other than nasopharyngeal swab, presence of viral mutation(s) within the areas targeted by this assay, and inadequate number of viral copies (<250 copies / mL). A negative result must be combined with clinical observations, patient history, and epidemiological information.  Fact Sheet for Patients:   BoilerBrush.com.cyhttps://www.fda.gov/media/136312/download  Fact Sheet for Healthcare Providers: https://pope.com/https://www.fda.gov/media/136313/download  This test is not yet approved or  cleared by the [place] FDA and has been authorized for detection and/or diagnosis of SARS-CoV-2 by FDA under an Emergency Use Authorization (EUA).  This EUA will remain in effect (meaning this test can be used) for the duration of the COVID-19 declaration under Section 564(b)(1) of the Act, 21 U.S.C. section 360bbb-3(b)(1), unless the authorization is terminated or revoked sooner.  Performed at [facility], [address] [place], [place] [address]   Aerobic/Anaerobic Culture (surgical/deep wound)     Status: None   Collection Time: 07/25/20  5:35 PM   Specimen: Abscess  Result Value Ref Range Status   Specimen Description ABSCESS  Final   Special Requests NONE  Final   Gram Stain   Final    RARE WBC PRESENT, PREDOMINANTLY PMN MODERATE GRAM NEGATIVE RODS FEW GRAM POSITIVE COCCI IN  CHAINS    Culture   Final    MODERATE STREPTOCOCCUS INTERMEDIUS MODERATE PORPHYROMONAS SPECIES BETA LACTAMASE NEGATIVE Performed at [facility], [address] [place], [place] [address]    Report Status 07/30/2020 FINAL  Final   Organism ID, Bacteria STREPTOCOCCUS INTERMEDIUS  Final      Susceptibility   Streptococcus intermedius - MIC*    PENICILLIN <=0.06 SENSITIVE Sensitive     CEFTRIAXONE 0.5 SENSITIVE Sensitive     ERYTHROMYCIN 4 RESISTANT Resistant     LEVOFLOXACIN 1 SENSITIVE Sensitive     VANCOMYCIN 1 SENSITIVE Sensitive     * MODERATE STREPTOCOCCUS INTERMEDIUS      Pertinent Imaging CT abdomen/pelvis FINDINGS: Urinary Tract:  No abnormality visualized.   Bowel:  Unremarkable visualized pelvic bowel  loops.   Vascular/Lymphatic: No aneurysm.  No suspicious adenopathy   Reproductive:  Calcified uterine fibroids.  No adnexal mass   Other: Negative for pelvic effusion. Inflammatory presacral mass, series 2, image 23 measuring 4.6 by 5 cm and extends from the S2 level to the coccyx.   Musculoskeletal: Metallic density at the right aspect of S1 segment. Diffuse sclerosis involving the sacrum and right posterior iliac bone. Focal irregular lucency within the right posterior iliac bone measuring 8 mm, series 2, image 15 with surrounding dense sclerosis and potential linear communication to the posterior SI joint on the right. Irregular hyperdense mass or complex fluid collection within the subcutaneous fat of the right gluteal region, measures about 7.8 cm transverse by 2.6 cm AP by 5.6 cm craniocaudad. There appear to be multiple areas contiguous with the skin surface, for example series 2, image 19, series 2, image 16. Ill-defined soft tissue density extending from the right parasagittal sacral region to the paraspinous musculature at the level of the mid sacrum. Indistinct appearing right iliacus muscle and right iliopsoas muscle likely due to edema.    IMPRESSION: 1. Diffuse heterogeneous sclerosis involving the sacrum and right posterior iliac bone, suspect related to chronic osteomyelitis. Small focal lucent area within the right posterior iliac bone with surrounding sclerosis is indeterminate for small intra osseous abscess. Slightly dense irregular soft tissue mass or complex collection within the subcutaneous fat of right gluteal region with multiple sinus tracts to the skin. This also involves the right gluteus musculature and right sacral paraspinal musculature. There is an approximate 5 cm phlegmonous mass within the presacral space. There is edema within the right iliacus and iliopsoas muscle probably due to myositis. MRI would better evaluate/characterize the osseous and soft tissue changes described above. 2. Metallic density foreign object, question surgical, in the right upper sacrum   All pertinent labs/Imagings/notes reviewed. All pertinent plain films and CT images have been personally visualized and interpreted; radiology reports have been reviewed. Decision making incorporated into the Impression / Recommendations.  have spent a total of 20 minutes of face-to-face and non-face-to-face time, excluding clinical staff time, preparing to see patient, ordering tests and/or medications, and provide counseling the patient    Electronically signed by:  Odette Fraction, MD Infectious Disease Physician [facility] [address] [place], [place] [address] Phone: [phone]  Fax: [phone]

## 2020-12-29 ENCOUNTER — Telehealth: Payer: Self-pay

## 2020-12-29 DIAGNOSIS — M4628 Osteomyelitis of vertebra, sacral and sacrococcygeal region: Secondary | ICD-10-CM | POA: Insufficient documentation

## 2020-12-29 DIAGNOSIS — Z5181 Encounter for therapeutic drug level monitoring: Secondary | ICD-10-CM | POA: Insufficient documentation

## 2020-12-29 DIAGNOSIS — L0231 Cutaneous abscess of buttock: Secondary | ICD-10-CM | POA: Insufficient documentation

## 2020-12-29 LAB — CBC WITH DIFFERENTIAL/PLATELET
Absolute Monocytes: 640 cells/uL (ref 200–950)
Basophils Absolute: 39 cells/uL (ref 0–200)
Basophils Relative: 0.5 %
Eosinophils Absolute: 117 cells/uL (ref 15–500)
Eosinophils Relative: 1.5 %
HCT: 33.2 % — ABNORMAL LOW (ref 35.0–45.0)
Hemoglobin: 10.1 g/dL — ABNORMAL LOW (ref 11.7–15.5)
Lymphs Abs: 2558 cells/uL (ref 850–3900)
MCH: 24.2 pg — ABNORMAL LOW (ref 27.0–33.0)
MCHC: 30.4 g/dL — ABNORMAL LOW (ref 32.0–36.0)
MCV: 79.6 fL — ABNORMAL LOW (ref 80.0–100.0)
MPV: 9.8 fL (ref 7.5–12.5)
Monocytes Relative: 8.2 %
Neutro Abs: 4446 cells/uL (ref 1500–7800)
Neutrophils Relative %: 57 %
Platelets: 512 10*3/uL — ABNORMAL HIGH (ref 140–400)
RBC: 4.17 10*6/uL (ref 3.80–5.10)
RDW: 15.4 % — ABNORMAL HIGH (ref 11.0–15.0)
Total Lymphocyte: 32.8 %
WBC: 7.8 10*3/uL (ref 3.8–10.8)

## 2020-12-29 LAB — BASIC METABOLIC PANEL
BUN: 16 mg/dL (ref 7–25)
CO2: 23 mmol/L (ref 20–32)
Calcium: 9.8 mg/dL (ref 8.6–10.4)
Chloride: 98 mmol/L (ref 98–110)
Creat: 0.68 mg/dL (ref 0.50–0.99)
Glucose, Bld: 164 mg/dL — ABNORMAL HIGH (ref 65–99)
Potassium: 4.8 mmol/L (ref 3.5–5.3)
Sodium: 135 mmol/L (ref 135–146)

## 2020-12-29 LAB — SEDIMENTATION RATE: Sed Rate: 65 mm/h — ABNORMAL HIGH (ref 0–30)

## 2020-12-29 LAB — C-REACTIVE PROTEIN: CRP: 59.6 mg/L — ABNORMAL HIGH (ref <8.0)

## 2020-12-29 NOTE — Telephone Encounter (Signed)
error 

## 2020-12-30 ENCOUNTER — Telehealth (HOSPITAL_COMMUNITY): Payer: Self-pay

## 2020-12-30 ENCOUNTER — Telehealth: Payer: Self-pay

## 2020-12-30 NOTE — Telephone Encounter (Signed)
Patient is calling to report severe diarrhea since starting her abx. Reports that she's had numerous BM's since this morning at 7am and it's turned into liquid. Sudden onset of the urge and worries about leaving the house out of fear she won't make it to the bathroom.   Has not taken any OTC medications. Requesting to stop taking the abx so she can make it to her MRI on 7/2.  Kaiden Pech Loyola Mast, RN

## 2020-12-30 NOTE — Telephone Encounter (Signed)
-----   Message from Richarda Overlie, MD sent at 12/30/2020  1:32 PM EDT ----- Regarding: RE: Aspiration Discussed with provider.  Try US guided aspiration / drainage (if needed) of right gluteal abscess/phlegmon.   See ID note for specific tests needed.  Henn ----- Message ----- From: Anderson Malta Sent: 12/29/2020   4:25 PM EDT To: Ir Procedure Requests Subject: Aspiration                                     Procedure: aspiration of the presacral phlegmon  Dx: right buttock abscess  Ordering: Dr. Odette Fraction [id number]) [facility]  Imaging: Ct pelvis done 12/17/20 in epic  Please review.   Thanks,  Fara Boros

## 2020-12-30 NOTE — Telephone Encounter (Signed)
Per Dr. Elinor Parkinson:  "OK to hold the antibiotics for now. She has a follow up with me next week,can discuss more at that time. If she does not feels well I(fevers, chills, nausea/vomiting and weakness), she will need to seek immediate attentionhowever."  RN instructed patient to hold antibiotics until seen next week with Dr. Elinor Parkinson.   Patient is reporting significant rectal pain that started shortly after the diarrhea began. RN instructed patient to try tylenol for pain relief with food and warm compresses.

## 2020-12-31 ENCOUNTER — Telehealth (HOSPITAL_COMMUNITY): Payer: Self-pay

## 2021-01-01 ENCOUNTER — Ambulatory Visit (HOSPITAL_COMMUNITY): Admission: RE | Admit: 2021-01-01 | Payer: Medicare Other | Source: Ambulatory Visit

## 2021-01-01 ENCOUNTER — Other Ambulatory Visit (HOSPITAL_COMMUNITY): Payer: Medicare Other

## 2021-01-01 ENCOUNTER — Ambulatory Visit (HOSPITAL_COMMUNITY): Payer: Medicare Other

## 2021-01-04 ENCOUNTER — Ambulatory Visit: Payer: Medicare Other | Admitting: Internal Medicine

## 2021-01-05 ENCOUNTER — Telehealth (INDEPENDENT_AMBULATORY_CARE_PROVIDER_SITE_OTHER): Payer: Medicare Other | Admitting: Infectious Diseases

## 2021-01-05 ENCOUNTER — Other Ambulatory Visit: Payer: Self-pay

## 2021-01-05 ENCOUNTER — Encounter: Payer: Self-pay | Admitting: Infectious Diseases

## 2021-01-05 ENCOUNTER — Telehealth: Payer: Self-pay | Admitting: *Deleted

## 2021-01-05 ENCOUNTER — Telehealth: Payer: Self-pay

## 2021-01-05 ENCOUNTER — Other Ambulatory Visit: Payer: Self-pay | Admitting: Radiology

## 2021-01-05 DIAGNOSIS — E119 Type 2 diabetes mellitus without complications: Secondary | ICD-10-CM | POA: Diagnosis not present

## 2021-01-05 DIAGNOSIS — L0231 Cutaneous abscess of buttock: Secondary | ICD-10-CM

## 2021-01-05 DIAGNOSIS — M4628 Osteomyelitis of vertebra, sacral and sacrococcygeal region: Secondary | ICD-10-CM

## 2021-01-05 NOTE — Telephone Encounter (Signed)
Pt called and stated she wanted to reschedule her appt for today due to her not having her MRI yet. Patient has an MRI scheduled for 01/14/2021. Dr. Elinor Parkinson does not have any thing on her schedule for the next few weeks. Pt is requesting a call back to schedule her f/u visit. Thanks

## 2021-01-05 NOTE — Telephone Encounter (Signed)
Spoke with patient and advised her that Dr. Elinor Parkinson would like for her to keep her appointment today. Patient is agreeable.   Sandie Ano, RN

## 2021-01-05 NOTE — Progress Notes (Signed)
Virtual Visit via Telephone Note  I connected withNAME@ on 01/05/21 at  3:45 PM EDT by a telephone enabled telemedicine application and verified that I am speaking with the correct person using two identifiers.  Location: Patient: Home  Provider: [facility]   I discussed the limitations of evaluation and management by telemedicine and the availability of in person appointments. The patient expressed understanding and agreed to proceed.  [facility]  Patient Active Problem List   Diagnosis Date Noted   Abscess of buttock, right 12/29/2020   Sacral osteomyelitis (HCC) 12/29/2020   Medication monitoring encounter 12/29/2020   Reactive thrombocytosis 08/16/2020   Deficiency anemia 08/13/2020   Type 2 diabetes mellitus without complication, without long-term current use of insulin (HCC) 08/13/2020   Gluteal abscess 07/25/2020   Status post surgery 07/25/2020   PTSD (post-traumatic stress disorder) 07/19/2018   GAD (generalized anxiety disorder) 07/19/2018    Patient's Medications  New Prescriptions   No medications on file  Previous Medications   ACETAMINOPHEN (TYLENOL) 500 MG TABLET    Take 2 tablets (1,000 mg total) by mouth every 6 (six) hours as needed for mild pain or fever.   ATORVASTATIN (LIPITOR) 20 MG TABLET    Take 20 mg by mouth at bedtime.   DICLOFENAC (CATAFLAM) 50 MG TABLET    Take 50 mg by mouth 2 (two) times daily as needed (for pain).   LANCETS (ONETOUCH DELICA PLUS LANCET33G) MISC    Apply 1 each topically daily.   LEVOFLOXACIN (LEVAQUIN) 750 MG TABLET    Take 1 tablet (750 mg total) by mouth daily.   LINAGLIPTIN (TRADJENTA) 5 MG TABS TABLET    Take 1 tablet (5 mg total) by mouth daily.   LISINOPRIL (ZESTRIL) 2.5 MG TABLET    Take 2.5 mg by mouth at bedtime.   LORATADINE (CLARITIN) 10 MG TABLET    Take 10 mg by mouth daily as needed for allergies or rhinitis.   METFORMIN (GLUCOPHAGE-XR) 500 MG 24 HR TABLET    Take 1 tablet (500 mg total) by  mouth in the morning and at bedtime.   ONETOUCH VERIO TEST STRIP    1 each daily.   OXYCODONE (OXY IR/ROXICODONE) 5 MG IMMEDIATE RELEASE TABLET    Take 1-2 tablets (5-10 mg total) by mouth every 4 (four) hours as needed for moderate pain.   SERTRALINE (ZOLOFT) 100 MG TABLET    Take 2 tablets (200 mg total) by mouth daily.   TRAMADOL (ULTRAM) 50 MG TABLET    TAKE 1 TO 2 TABLETS BY MOUTH TWICE DAILY WHEN NECESSARY  Modified Medications   No medications on file  Discontinued Medications   No medications on file    History of Present Illness: Patient seen by me last week for chronic Osteomyelitis of rt sacrum/rt posterior iliac bone, possible abscess/rt gluteal mass with multiple sinus tracts to skin/Prescaral phlegmonous mass. I ordered MRI pelvis at that time for better assessment of her pelvis region which was scheduled on 7/2. However, she had to cancel that appointment because she developed severe diarrhea after starting taking the antibiotics I prescribed her last visit ( Levofloxacin and metronidazole). She has stopped taking PO antibiotics after first few days. Unfortunately her MRI pelvis got pushed away to 7/15. She was scheduled to get a IR aspiration of Rt gluteal mass/presacral phelgmonous mass on 7/7 but she cancelled herself without notifying anyone thinking her MRI has been pushed to 7/15. I spoke to her today explaining the reason for  getting an IR guided aspiration, clarified all her queries and asked her to call back to reschedule appointment with IR again. I called her back 1 hour later and fortunately she was able to get herself scheduled for 7/8. I also prescribed her augmentin to be taken after her IR procedure starting this Saturday. She tells me that she has taken Augmentin in the past and had no issues.   She was actually supposed to have an in person visit which she cancelled on the day of visit. After knowing that she had cancelled the IR aspiration procedure for 7/7, I asked  staff to request her ti at least do a phone visit so that I can explain her if there is anything she is not understanding or missing to which she agreed.   In terms of symptoms of her rt buttock , she does not feel anything different in comparison to when seen last week. Denies any fevers, chills and sweats. Denies any nasuea, vomiting, abdominal pain and diarrhea. No new swellings/redness/tenderness in her rt buttock.   ROS 12 point ROS don with pertinent positives and negatives listed above   Past Medical History:  Diagnosis Date   Anal fissure    Dr. Loreta Ave   Colon polyp    Diabetes mellitus without complication (HCC)    diet and exercise controlled    Social History   Tobacco Use   Smoking status: Never   Smokeless tobacco: Never  Substance Use Topics   Alcohol use: No   Drug use: No    Family History  Problem Relation Age of Onset   Diabetes Sister    Osteoarthritis Sister    Breast cancer Maternal Aunt    Cancer Maternal Grandfather    Heart failure Paternal Grandfather     Allergies  Allergen Reactions   Cephalexin Rash    Health Maintenance  Topic Date Due   COVID-19 Vaccine (1) Never done   FOOT EXAM  Never done   OPHTHALMOLOGY EXAM  Never done   Hepatitis C Screening  Never done   TETANUS/TDAP  Never done   Zoster Vaccines- Shingrix (1 of 2) Never done   MAMMOGRAM  05/15/2015   COLONOSCOPY (Pts 45-4yrs Insurance coverage will need to be confirmed)  04/25/2020   DEXA SCAN  12/23/2020   PNA vac Low Risk Adult (1 of 2 - PCV13) 12/23/2020   HEMOGLOBIN A1C  01/22/2021   INFLUENZA VACCINE  01/31/2021   PAP SMEAR-Modifier  05/21/2021   HIV Screening  Completed   HPV VACCINES  Aged Out    Observations/Objective: Frustrated  Assessment and Plan: Chronic Osteomyelitis RT Sacrum/Rt posterior iliac bone, possible abscess/Rt gluteal mass with multiple sinus tracts to skin Presacral Phlegmonous mass * 5 cm Rt iliacus/Iliopsoas  myositis Poorly controlled  DM- follows up with PCP     Follow Up Instructions: Follow up with IR guided aspiration. I have discussed with IR previously and they are aware that will need gram stain, aerobic and anaerobic cx, fungal stain and cx, AFB stain and cx + Path I have prescribed her Augmentin to be taken after her procedure from Saturday. She tells me she has tolerated augmentin in the past Fu MRI pelvis. She might need IV antibiotics based on MR pelvis findings/cultures and path, will hold off on IV antibiotics for now pending MR pelvis and IR aspiration/patient's reluctance for having a PICC line Follow up in 2 weeks with one of the other ID providers as I will be away  I discussed the assessment and treatment plan with the patient. The patient was provided an opportunity to ask questions and all were answered. The patient agreed with the plan and demonstrated an understanding of the instructions.   The patient was advised to call back or seek an in-person evaluation if the symptoms worsen or if the condition fails to improve as anticipated.  I provided 20 minutes of non-face-to-face time during this encounter.  Victoriano Lain, MD [facility] Phone [phone] Fax no. [phone]  01/05/2021, 1:41 PM

## 2021-01-05 NOTE — Telephone Encounter (Signed)
Patient called to let Dr. Elinor Parkinson know she has an appointment schedule on 01/07/21 for a Ultrasound biopsy at [facility] Ultrasound. Contract number [phone] [phone] if any questions.

## 2021-01-06 ENCOUNTER — Telehealth: Payer: Self-pay

## 2021-01-06 ENCOUNTER — Ambulatory Visit (HOSPITAL_COMMUNITY): Payer: Medicare Other

## 2021-01-06 MED ORDER — AMOXICILLIN-POT CLAVULANATE 875-125 MG PO TABS: 1.0000 | ORAL_TABLET | Freq: Two times a day (BID) | ORAL | 1 refills | Status: AC

## 2021-01-06 NOTE — Telephone Encounter (Signed)
Left patient a voice mail to call back to schedule a follow up 1 month for 02/01/21 with Dr. Earlene Plater needs a appointment and labs in weeks aroungd

## 2021-01-06 NOTE — Telephone Encounter (Signed)
Patient needs a 1 month follow with Dr. Earlene Plater for around 02/01/21 and a 2 week lab appointment around 01/20/21,

## 2021-01-07 ENCOUNTER — Other Ambulatory Visit: Payer: Self-pay

## 2021-01-07 ENCOUNTER — Ambulatory Visit (HOSPITAL_COMMUNITY)
Admission: RE | Admit: 2021-01-07 | Discharge: 2021-01-07 | Disposition: A | Discharge status: Home / Self Care | Payer: Medicare Other | Source: Ambulatory Visit | Attending: Infectious Diseases | Admitting: Infectious Diseases

## 2021-01-07 ENCOUNTER — Encounter (HOSPITAL_COMMUNITY): Payer: Self-pay

## 2021-01-07 ENCOUNTER — Other Ambulatory Visit: Payer: Self-pay | Admitting: Infectious Diseases

## 2021-01-07 ENCOUNTER — Telehealth: Payer: Self-pay

## 2021-01-07 DIAGNOSIS — L0231 Cutaneous abscess of buttock: Secondary | ICD-10-CM | POA: Diagnosis not present

## 2021-01-07 DIAGNOSIS — Z01818 Encounter for other preprocedural examination: Secondary | ICD-10-CM | POA: Diagnosis not present

## 2021-01-07 DIAGNOSIS — Z539 Procedure and treatment not carried out, unspecified reason: Secondary | ICD-10-CM | POA: Diagnosis not present

## 2021-01-07 LAB — PROTIME-INR
INR: 1.1 (ref 0.8–1.2)
Prothrombin Time: 13.9 seconds (ref 11.4–15.2)

## 2021-01-07 LAB — GLUCOSE, CAPILLARY: Glucose-Capillary: 157 mg/dL — ABNORMAL HIGH (ref 70–99)

## 2021-01-07 LAB — CBC
HCT: 33.3 % — ABNORMAL LOW (ref 36.0–46.0)
Hemoglobin: 10.2 g/dL — ABNORMAL LOW (ref 12.0–15.0)
MCH: 24.6 pg — ABNORMAL LOW (ref 26.0–34.0)
MCHC: 30.6 g/dL (ref 30.0–36.0)
MCV: 80.4 fL (ref 80.0–100.0)
Platelets: 428 10*3/uL — ABNORMAL HIGH (ref 150–400)
RBC: 4.14 MIL/uL (ref 3.87–5.11)
RDW: 17 % — ABNORMAL HIGH (ref 11.5–15.5)
WBC: 6.4 10*3/uL (ref 4.0–10.5)
nRBC: 0 % (ref 0.0–0.2)

## 2021-01-07 MED ORDER — SODIUM CHLORIDE 0.9 % IV SOLN: INTRAVENOUS | Status: DC

## 2021-01-07 MED ORDER — LIDOCAINE HCL (PF) 1 % IJ SOLN
INTRAMUSCULAR | Status: AC
  Filled 2021-01-07: qty 30

## 2021-01-07 MED ORDER — MIDAZOLAM HCL 2 MG/2ML IJ SOLN
INTRAMUSCULAR | Status: AC
  Filled 2021-01-07: qty 4

## 2021-01-07 MED ORDER — FENTANYL CITRATE (PF) 100 MCG/2ML IJ SOLN
INTRAMUSCULAR | Status: AC
  Filled 2021-01-07: qty 4

## 2021-01-07 NOTE — Telephone Encounter (Signed)
Husband called to report that procedure was cancelled since no drainage was present. Please send Abx to [facility] on [address] and have triage call Monday to discuss plan moving forward.

## 2021-01-07 NOTE — Sedation Documentation (Signed)
Doctor spoke with patient and explained he did not have to perform the procedure. Patient verbalized understanding.

## 2021-01-07 NOTE — H&P (Signed)
Chief Complaint: Patient was seen in consultation today for image guided aspiration and possible drain placement of right gluteal abscess at the request of Manandhar,Sabina   Referring Physician(s): Manandhar,Sabina  Supervising Physician: Marliss Coots  Patient Status: [facility] - Out-pt  History of Present Illness: Stephanie Stokes is a 65 y.o. female with PMH of DM, colon polyp, anal fissure, right buttock abscess s/p I&D with general surgery in January 2022 who was referred to IR for aspiration and possible drain placement of a right gluteal abscess. Patient underwent follow-up CT pelvis with contrast on 12/17/2020 which showed:   1. Diffuse heterogeneous sclerosis involving the sacrum and right posterior iliac bone, suspect related to chronic osteomyelitis. Small focal lucent area within the right posterior iliac bone with surrounding sclerosis is indeterminate for small intra osseous abscess. Slightly dense irregular soft tissue mass or complex collection within the subcutaneous fat of right gluteal region with multiple sinus tracts to the skin. This also involves the right gluteus musculature and right sacral paraspinal musculature. There is an approximate 5 cm phlegmonous mass within the presacral space. There is edema within the right iliacus and iliopsoas muscle probably due to myositis. MRI would better evaluate/characterize the osseous and soft tissue changes described above. 2. Metallic density foreign object, question surgical, in the right upper sacrum   Patient was referred to infectious disease, was evaluated by Dr.Manandhar on 12/28/2020 who recommended aspiration and possible drain placement of the right gluteal abscess. After thorough discussion and shared decision making, patient decided to proceed with the procedure.   IR was requested for image guided aspiration and possible drain placement of the right gluteal abscess.   Patient laying in bed, not in acute  distress.  Reports headache due to being NPO.  Denise fever, chills, shortness of breath, cough, chest pain, abdominal pain, nausea ,vomiting, and bleeding.  Past Medical History:  Diagnosis Date   Anal fissure    Dr. Loreta Ave   Colon polyp    Diabetes mellitus without complication (HCC)    diet and exercise controlled    Past Surgical History:  Procedure Laterality Date   ANAL FISSURE REPAIR  10/20/10   Dr. Loreta Ave   CESAREAN SECTION   02/15/79 & 03/31/83   IRRIGATION AND DEBRIDEMENT ABSCESS Right 07/25/2020   Procedure: IRRIGATION AND DEBRIDEMENT ABSCESS;  Surgeon: Berna Bue, MD;  Location: [facility] OR;  Service: General;  Laterality: Right;   PELVIC LAPAROSCOPY  02/22/79   lysis of adhesions   TONSILLECTOMY  age 64    Allergies: Cephalexin  Medications: Prior to Admission medications   Medication Sig Start Date End Date Taking? Authorizing Provider  acetaminophen (TYLENOL) 500 MG tablet Take 2 tablets (1,000 mg total) by mouth every 6 (six) hours as needed for mild pain or fever. 07/28/20  Yes Trixie Deis R, PA-C  atorvastatin (LIPITOR) 20 MG tablet Take 20 mg by mouth at bedtime. 03/17/20  Yes [provider]  lisinopril (ZESTRIL) 2.5 MG tablet Take 2.5 mg by mouth at bedtime. 02/01/20  Yes [provider]  loratadine (CLARITIN) 10 MG tablet Take 10 mg by mouth daily as needed for allergies or rhinitis.   Yes [provider]  sertraline (ZOLOFT) 100 MG tablet Take 2 tablets (200 mg total) by mouth daily. 10/27/20  Yes Cottle, Steva Ready., MD  amoxicillin-clavulanate (AUGMENTIN) 875-125 MG tablet Take 1 tablet by mouth 2 (two) times daily for 7 days. 01/08/21 01/15/21  Odette Fraction, MD  diclofenac (CATAFLAM) 50 MG tablet Take  50 mg by mouth 2 (two) times daily as needed (for pain). 07/12/20   [provider]  Lancets (ONETOUCH DELICA PLUS [place]33G) MISC Apply 1 each topically daily. 02/20/20   [provider]  levofloxacin (LEVAQUIN) 750 MG  tablet Take 1 tablet (750 mg total) by mouth daily. Patient not taking: Reported on 01/05/2021 12/28/20   Odette FractionManandhar, Sabina, MD  linagliptin (TRADJENTA) 5 MG TABS tablet Take 1 tablet (5 mg total) by mouth daily. 07/28/20   Juliet RudeJohnson, Kelly R, PA-C  metFORMIN (GLUCOPHAGE-XR) 500 MG 24 hr tablet Take 1 tablet (500 mg total) by mouth in the morning and at bedtime. Patient taking differently: Take 500 mg by mouth. 2 tabs am ,   2 tabs  pm 07/28/20   Juliet RudeJohnson, Kelly R, PA-C  [facility] VERIO test strip 1 each daily. 03/08/20   [provider]  oxyCODONE (OXY IR/ROXICODONE) 5 MG immediate release tablet Take 1-2 tablets (5-10 mg total) by mouth every 4 (four) hours as needed for moderate pain. 07/28/20   Juliet RudeJohnson, Kelly R, PA-C  traMADol (ULTRAM) 50 MG tablet TAKE 1 TO 2 TABLETS BY MOUTH TWICE DAILY WHEN NECESSARY 07/30/20   [provider]     Family History  Problem Relation Age of Onset   Diabetes Sister    Osteoarthritis Sister    Breast cancer Maternal Aunt    Cancer Maternal Grandfather    Heart failure Paternal Grandfather     Social History   Socioeconomic History   Marital status: Married    Spouse name: Not on file   Number of children: 2   Years of education: Not on file   Highest education level: Not on file  Occupational History   Occupation: Emergency planning/management officerroject manager   Tobacco Use   Smoking status: Never   Smokeless tobacco: Never  Substance and Sexual Activity   Alcohol use: No   Drug use: No   Sexual activity: Not on file  Other Topics Concern   Not on file  Social History Narrative   Daughter Hospital doctorAmber who had been diagnosed with Bipolar disease committed suicide on her 89 [address] birthday in 2013.   Social Determinants of Health   Financial Resource Strain: Not on file  Food Insecurity: Not on file  Transportation Needs: Not on file  Physical Activity: Not on file  Stress: Not on file  Social Connections: Not on file     Review of Systems: A 12 point ROS discussed and  pertinent positives are indicated in the HPI above.  All other systems are negative.   Vital Signs: BP (!) 142/85   Pulse 85   Temp 98.4 F (36.9 C) (Oral)   Ht 4\' 11"  (1.499 m)   Wt 124 lb (56.2 kg)   SpO2 100%   BMI 25.04 kg/m   Physical Exam  Vitals and nursing note reviewed.  Constitutional:      General: He is not in acute distress.    Appearance: Normal appearance.  HENT:     Head: Normocephalic and atraumatic.     Mouth/Throat:     Mouth: Mucous membranes are moist.     Pharynx: Oropharynx is clear.  Cardiovascular:     Rate and Rhythm: Normal rate and regular rhythm.     Pulses: Normal pulses.     Heart sounds: Normal heart sounds.  Pulmonary:     Effort: Pulmonary effort is normal.     Breath sounds: Normal breath sounds. No wheezing, rhonchi or rales.  Abdominal:  General: Bowel sounds are normal. There is no distension.     Palpations: Abdomen is soft.  Skin:    General: Skin is warm and dry.  Neurological:     Mental Status: He is alert and oriented to person, place, and time.  Psychiatric:        Mood and Affect: Mood normal.        Behavior: Behavior normal.    MD Evaluation Airway: WNL Heart: WNL Abdomen: WNL Chest/ Lungs: WNL ASA  Classification: 2 Mallampati/Airway Score: Two  Imaging: CT PELVIS W CONTRAST  Result Date: 12/17/2020 CLINICAL DATA:  Buttock abscess EXAM: CT PELVIS WITH CONTRAST TECHNIQUE: Multidetector CT imaging of the pelvis was performed using the standard protocol following the bolus administration of intravenous contrast. CONTRAST:  55mL OMNIPAQUE IOHEXOL 300 MG/ML  SOLN COMPARISON:  None. FINDINGS: Urinary Tract:  No abnormality visualized. Bowel:  Unremarkable visualized pelvic bowel loops. Vascular/Lymphatic: No aneurysm.  No suspicious adenopathy Reproductive:  Calcified uterine fibroids.  No adnexal mass Other: Negative for pelvic effusion. Inflammatory presacral mass, series 2, image 23 measuring 4.6 by 5 cm and  extends from the S2 level to the coccyx. Musculoskeletal: Metallic density at the right aspect of S1 segment. Diffuse sclerosis involving the sacrum and right posterior iliac bone. Focal irregular lucency within the right posterior iliac bone measuring 8 mm, series 2, image 15 with surrounding dense sclerosis and potential linear communication to the posterior SI joint on the right. Irregular hyperdense mass or complex fluid collection within the subcutaneous fat of the right gluteal region, measures about 7.8 cm transverse by 2.6 cm AP by 5.6 cm craniocaudad. There appear to be multiple areas contiguous with the skin surface, for example series 2, image 19, series 2, image 16. Ill-defined soft tissue density extending from the right parasagittal sacral region to the paraspinous musculature at the level of the mid sacrum. Indistinct appearing right iliacus muscle and right iliopsoas muscle likely due to edema. IMPRESSION: 1. Diffuse heterogeneous sclerosis involving the sacrum and right posterior iliac bone, suspect related to chronic osteomyelitis. Small focal lucent area within the right posterior iliac bone with surrounding sclerosis is indeterminate for small intra osseous abscess. Slightly dense irregular soft tissue mass or complex collection within the subcutaneous fat of right gluteal region with multiple sinus tracts to the skin. This also involves the right gluteus musculature and right sacral paraspinal musculature. There is an approximate 5 cm phlegmonous mass within the presacral space. There is edema within the right iliacus and iliopsoas muscle probably due to myositis. MRI would better evaluate/characterize the osseous and soft tissue changes described above. 2. Metallic density foreign object, question surgical, in the right upper sacrum Electronically Signed   By: Jasmine Pang M.D.   On: 12/17/2020 16:57    Labs:  CBC: Recent Labs    08/13/20 1241 11/15/20 0913 12/28/20 0944  01/07/21 1119  WBC 8.9 8.8 7.8 6.4  HGB 10.0* 9.7* 10.1* 10.2*  HCT 30.7* 30.4* 33.2* 33.3*  PLT 509* 412* 512* 428*    COAGS: Recent Labs    07/25/20 1308  INR 1.5*    BMP: Recent Labs    07/25/20 1027 07/26/20 0251 08/13/20 1241 12/17/20 1607 12/28/20 0944  NA 129* 131* 133*  --  135  K 4.4 4.1 4.3  --  4.8  CL 94* 101 101  --  98  CO2 18* 20* 22  --  23  GLUCOSE 417* 292* 147*  --  164*  BUN 16  16 17  --  16  CALCIUM 9.5 8.5* 9.1  --  9.8  CREATININE 0.79 0.92 0.71 0.60 0.68  GFRNONAA >60 >60 >60  --   --     LIVER FUNCTION TESTS: Recent Labs    07/25/20 1027 08/13/20 1241  BILITOT 1.0 0.3  AST 77* 18  ALT 73* 12  ALKPHOS 347* 176*  PROT 7.4 7.6  ALBUMIN 2.6* 3.4*    TUMOR MARKERS: No results for input(s): AFPTM, CEA, CA199, CHROMGRNA in the last 8760 hours.  Assessment and Plan: 65 y.o. female with history of right buttocks abscess s/p I&D with general surgery in January 2022.  Follow-up CT in June 22 showed fluid collection in right gluteal region.  Patient was referred to ID for further evaluation management, who recommended aspiration and possible drain placement of the right gluteal fluid collection.  After thorough discussion and shared decision making with ID, patient decided to proceed with the procedure.   IR was requested for image guided aspiration and possible drain placement of the right gluteal fluid collection. Case was reviewed and approved for ultrasound-guided aspiration and possible drain placement by Dr. Lowella Dandy. Patient presents to [facility] IR today for the procedure. N.p.o. since midnight VSS CBC stable  INR pending Not on anticoagulation/antiplatelet treatment.    Risks and benefits discussed with the patient including bleeding, infection, damage to adjacent structures, bowel perforation/fistula connection, and sepsis.   All of the patient's questions were answered, patient is agreeable to proceed. Consent signed and in chart.  Thank  you for this interesting consult.  I greatly enjoyed meeting Stephanie Stokes and look forward to participating in their care.  A copy of this report was sent to the requesting provider on this date.  Electronically Signed: Willette Brace, PA-C 01/07/2021, 1:21 PM   I spent a total of  40 Minutes   in face to face in clinical consultation, greater than 50% of which was counseling/coordinating care for right gluteal fluid collection aspiration and possible drain placement '

## 2021-01-07 NOTE — Procedures (Signed)
Interventional Radiology Procedure Note  Pre-procedure ultrasound evaluation of the right buttock area of concern demonstrated extensive subcutaneous solid to phelgmonous appearing, irregularly-shaped region.  No discernable fluid to aspirate or drain.  Likely representing resolving abscess or hematoma.  No aspiration was performed.   Marliss Coots, MD

## 2021-01-07 NOTE — Sedation Documentation (Signed)
Patient changed clothes, given sandwich meal, and ambulated to waiting room where husband was waiting.

## 2021-01-10 ENCOUNTER — Encounter: Payer: Self-pay | Admitting: Infectious Diseases

## 2021-01-10 NOTE — Progress Notes (Signed)
Spoke with patient today to discuss plans as IR was not able to aspirate anything ( presacral phlegmonous mass vs soft tissue mass in the rt gluteal region). Patient tells me MRI was also cancelled as it was not thought to be helpful after discussion of patient and IR. At this point, there are no recent cultures available, I discussed with her regarding empirically treating with IV antibiotics and placing a PICC line. She had a lot of questions regarding having a PICC line/IV antibiotics and whether that is going to heal her completely or not. I told her will have to follow her clinically to see how she does.  She is reluctant  to do IV antibiotics and prefers to continue PO Augmentin for now. She will call us back in the clinic if she changes her mind. She agrees to come in the clinic in next 2 weeks for follow up and labs with one of my partners.

## 2021-01-10 NOTE — Telephone Encounter (Signed)
Patient called office to follow up after ultrasound that occurred on 01/07/2021. Per patient (and confirmed with provider note) that needle aspiration was NOT completed at [place] on 01/07/2021. Patient was told by that provider that he would cancel the MRI as he felt she did not need it. Patient wants to know if it's necessary since biopsy was not completed.   Patient also verbalized frustration with ongoing process. Increasingly irritated with what she feels is lack of communication with her and wants Dr. Elinor Parkinson to be sure of next steps. Does not want an appointment unless being seen is necessary.   Forwarding to provider.  Kennet Mccort Loyola Mast, RN

## 2021-01-11 ENCOUNTER — Other Ambulatory Visit: Payer: Self-pay

## 2021-01-11 ENCOUNTER — Ambulatory Visit (INDEPENDENT_AMBULATORY_CARE_PROVIDER_SITE_OTHER): Payer: Medicare Other | Admitting: Internal Medicine

## 2021-01-11 ENCOUNTER — Encounter: Payer: Self-pay | Admitting: Internal Medicine

## 2021-01-11 ENCOUNTER — Telehealth: Payer: Self-pay

## 2021-01-11 DIAGNOSIS — D509 Iron deficiency anemia, unspecified: Secondary | ICD-10-CM | POA: Diagnosis not present

## 2021-01-11 DIAGNOSIS — M4628 Osteomyelitis of vertebra, sacral and sacrococcygeal region: Secondary | ICD-10-CM | POA: Diagnosis not present

## 2021-01-11 DIAGNOSIS — R197 Diarrhea, unspecified: Secondary | ICD-10-CM | POA: Diagnosis not present

## 2021-01-11 NOTE — Assessment & Plan Note (Signed)
I will have her obtain a stool specimen for C. difficile PCR.  I will call her as soon as we have results and arrange a phone follow-up visit in 1 week.

## 2021-01-11 NOTE — Addendum Note (Signed)
Addended by: Harley Alto on: 01/11/2021 04:52 PM   Modules accepted: Orders

## 2021-01-11 NOTE — Progress Notes (Addendum)
[facility]  Patient Active Problem List   Diagnosis Date Noted   Diarrhea 01/11/2021    Priority: High   Abscess of buttock, right 12/29/2020    Priority: High   Sacral osteomyelitis (HCC) 12/29/2020    Priority: High   Microcytic anemia 01/11/2021   Medication monitoring encounter 12/29/2020   Reactive thrombocytosis 08/16/2020   Deficiency anemia 08/13/2020   Type 2 diabetes mellitus without complication, without long-term current use of insulin (HCC) 08/13/2020   Status post surgery 07/25/2020   PTSD (post-traumatic stress disorder) 07/19/2018   GAD (generalized anxiety disorder) 07/19/2018    Patient's Medications  New Prescriptions   No medications on file  Previous Medications   ACETAMINOPHEN (TYLENOL) 500 MG TABLET    Take 2 tablets (1,000 mg total) by mouth every 6 (six) hours as needed for mild pain or fever.   AMOXICILLIN-CLAVULANATE (AUGMENTIN) 875-125 MG TABLET    Take 1 tablet by mouth 2 (two) times daily for 7 days.   ATORVASTATIN (LIPITOR) 20 MG TABLET    Take 20 mg by mouth at bedtime.   DICLOFENAC (CATAFLAM) 50 MG TABLET    Take 50 mg by mouth 2 (two) times daily as needed (for pain).   LANCETS (ONETOUCH DELICA PLUS LANCET33G) MISC    Apply 1 each topically daily.   LINAGLIPTIN (TRADJENTA) 5 MG TABS TABLET    Take 1 tablet (5 mg total) by mouth daily.   LISINOPRIL (ZESTRIL) 2.5 MG TABLET    Take 2.5 mg by mouth at bedtime.   LORATADINE (CLARITIN) 10 MG TABLET    Take 10 mg by mouth daily as needed for allergies or rhinitis.   METFORMIN (GLUCOPHAGE-XR) 500 MG 24 HR TABLET    Take 1 tablet (500 mg total) by mouth in the morning and at bedtime.   ONETOUCH VERIO TEST STRIP    1 each daily.   OXYCODONE (OXY IR/ROXICODONE) 5 MG IMMEDIATE RELEASE TABLET    Take 1-2 tablets (5-10 mg total) by mouth every 4 (four) hours as needed for moderate pain.   SERTRALINE (ZOLOFT) 100 MG TABLET    Take 2 tablets (200 mg total) by mouth daily.    TRAMADOL (ULTRAM) 50 MG TABLET    TAKE 1 TO 2 TABLETS BY MOUTH TWICE DAILY WHEN NECESSARY  Modified Medications   No medications on file  Discontinued Medications   LEVOFLOXACIN (LEVAQUIN) 750 MG TABLET    Take 1 tablet (750 mg total) by mouth daily.    Subjective: Stephanie Stokes is in for a work in visit.  She has been followed by my partner, Dr. Odette Fraction, recently for a smoldering right buttock phlegmon complicated by pelvic osteomyelitis.  She has developed diarrhea over the past 2 weeks has many questions about whether or not she would need to be on IV or oral antibiotics for her infection.  She tells me that she has been under a great deal of stress recently due to her medical problems.  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  She continues to take oral amoxicillin clavulanate which was started on 01/05/2021.  Her diarrhea has recurred.  She is having 7-8 watery bowel movements daily.  She is also having some nausea and says that she just does not feel well.  She says that her right buttock wound is actually doing much better.  She is not having any new redness, swelling  or drainage.  She did undergo an ultrasound of the area on 01/07/2021.  No fluid collections were noted so an aspiration was not attempted and her pelvic MRI was canceled.  Review of Systems: Review of Systems  Constitutional:  Negative for chills, diaphoresis and fever.  Gastrointestinal:  Positive for diarrhea and nausea. Negative for abdominal pain and vomiting.   Past Medical History:  Diagnosis Date   Anal fissure    Dr. Loreta Ave   Colon polyp    Diabetes mellitus without complication (HCC)    diet and exercise controlled    Social History   Tobacco Use   Smoking status: Never   Smokeless tobacco: Never  Substance Use Topics   Alcohol use: No   Drug use: No    Family History  Problem Relation Age of Onset   Diabetes Sister    Osteoarthritis Sister     Breast cancer Maternal Aunt    Cancer Maternal Grandfather    Heart failure Paternal Grandfather     Allergies  Allergen Reactions   Cephalexin Rash    Objective: Vitals:   01/11/21 1543  BP: 115/77  Pulse: (!) 104  Temp: 97.8 F (36.6 C)  TempSrc: Oral  SpO2: 98%  Weight: 129 lb (58.5 kg)  Height: 4\' 11"  (1.499 m)   Body mass index is 26.05 kg/m.  Physical Exam Constitutional:      Comments: She is pleasant and in no distress.  She is accompanied by her husband who is a retired .  Cardiovascular:     Rate and Rhythm: Normal rate.  Pulmonary:     Effort: Pulmonary effort is normal.  Psychiatric:        Mood and Affect: Mood normal.   Pelvic CT scan 12/17/2020 IMPRESSION: 1. Diffuse heterogeneous sclerosis involving the sacrum and right posterior iliac bone, suspect related to chronic osteomyelitis. Small focal lucent area within the right posterior iliac bone with surrounding sclerosis is indeterminate for small intra osseous abscess. Slightly dense irregular soft tissue mass or complex collection within the subcutaneous fat of right gluteal region with multiple sinus tracts to the skin. This also involves the right gluteus musculature and right sacral paraspinal musculature. There is an approximate 5 cm phlegmonous mass within the presacral space. There is edema within the right iliacus and iliopsoas muscle probably due to myositis. MRI would better evaluate/characterize the osseous and soft tissue changes described above. 2. Metallic density foreign object, question surgical, in the right upper sacrum    Lab Results Sed Rate  Date Value  12/28/2020 65 mm/h (H)  11/15/2020 85 mm/hr (H)  08/13/2020 77 mm/hr (H)   CRP (mg/L)  Date Value  12/28/2020 59.6 (H)      Problem List Items Addressed This Visit       High   Sacral osteomyelitis (HCC)    No drainable abscess was noted on her recent ultrasound.  I did my best to clarify  for them that she does not need any further diagnostic procedures.  I will, however, see if any cultures were obtained in her general surgeon's office when she underwent her last I&D on 12/16/2020.  Dr. 12/18/2020 had recommended to her a first-line treatment regimen involving IV ceftriaxone and oral metronidazole.  She had many questions about PICC lines and how that would work.  She says that she tries to approach any difficult decision by fully understanding her options before making a decision.  She will stay on amoxicillin clavulanate for now  while we try to sort out whether or not she has C. difficile infection which would further complicate treatment and decision-making for her.       Diarrhea    I will have her obtain a stool specimen for C. difficile PCR.  I will call her as soon as we have results and arrange a phone follow-up visit in 1 week.       Relevant Orders   Clostridium Difficile by PCR([facility])     Unprioritized   Microcytic anemia     Cliffton Asters, MD [facility] [phone] pager   [phone] cell 01/11/2021, 4:42 PM

## 2021-01-11 NOTE — Telephone Encounter (Signed)
Patient called to discuss antibiotic options with Dr. Elinor Parkinson. Reports that the PO augmentin has caused diarrhea (starting yesterday evening) and this morning. She's concerned about the side effects that could occur with IV abx. RN explained that it's dependent on the medication and the person so it's hard to gauge. Patient requested the information be passed along to Dr. Elinor Parkinson. She doesn't want to pursue IV abx if they "aren't going to help anything" or make her feel worse.

## 2021-01-11 NOTE — Telephone Encounter (Signed)
Patient scheduled with Dr. Orvan Falconer today to discuss her antibiotics and how to proceed. Patient states she is unsure what to do as far as the diarrhea with the Augmentin, but does not wish to stop the medication and does not want to take anything OTC to help with diarrhea.

## 2021-01-11 NOTE — Addendum Note (Signed)
Addended by: Andree Coss on: 01/11/2021 04:56 PM   Modules accepted: Orders

## 2021-01-11 NOTE — Assessment & Plan Note (Signed)
No drainable abscess was noted on her recent ultrasound.  I did my best to clarify for them that she does not need any further diagnostic procedures.  I will, however, see if any cultures were obtained in her general surgeon's office when she underwent her last I&D on 12/16/2020.  Dr. Elinor Parkinson had recommended to her a first-line treatment regimen involving IV ceftriaxone and oral metronidazole.  She had many questions about PICC lines and how that would work.  She says that she tries to approach any difficult decision by fully understanding her options before making a decision.  She will stay on amoxicillin clavulanate for now while we try to sort out whether or not she has C. difficile infection which would further complicate treatment and decision-making for her.

## 2021-01-11 NOTE — Progress Notes (Signed)
Medical ROI form faxed to [facility] requesting office notes + any wound cultures obtained.   Fax: [phone]  Rosanna Randy, RN

## 2021-01-12 ENCOUNTER — Telehealth: Payer: Self-pay | Admitting: Internal Medicine

## 2021-01-12 ENCOUNTER — Encounter: Payer: Self-pay | Admitting: Hematology and Oncology

## 2021-01-12 ENCOUNTER — Other Ambulatory Visit: Payer: Self-pay | Admitting: Internal Medicine

## 2021-01-12 LAB — C. DIFFICILE GDH AND TOXIN A/B
GDH ANTIGEN: NOT DETECTED
MICRO NUMBER:: 12110375
SPECIMEN QUALITY:: ADEQUATE
TOXIN A AND B: NOT DETECTED

## 2021-01-12 NOTE — Telephone Encounter (Signed)
I called Ms. Stephanie Stokes today and informed her that her C. difficile assay was negative.  She is still having some intermittent watery diarrhea.  I have asked her to obtain some Imodium A-D from her pharmacy and try that as needed for her diarrhea.  She will continue Augmentin for now and I have arranged phone follow-up next week.

## 2021-01-14 ENCOUNTER — Ambulatory Visit (HOSPITAL_COMMUNITY): Payer: Medicare Other

## 2021-01-15 ENCOUNTER — Other Ambulatory Visit: Payer: Self-pay | Admitting: Infectious Diseases

## 2021-01-17 ENCOUNTER — Other Ambulatory Visit: Payer: Self-pay

## 2021-01-18 ENCOUNTER — Telehealth (INDEPENDENT_AMBULATORY_CARE_PROVIDER_SITE_OTHER): Payer: Medicare Other | Admitting: Internal Medicine

## 2021-01-18 ENCOUNTER — Other Ambulatory Visit: Payer: Self-pay

## 2021-01-18 DIAGNOSIS — L0231 Cutaneous abscess of buttock: Secondary | ICD-10-CM | POA: Diagnosis not present

## 2021-01-18 NOTE — Progress Notes (Signed)
Virtual Visit via Telephone Note  I connected with Stephanie Stokes on 01/18/21 at  2:15 PM EDT by telephone and verified that I am speaking with the correct person using two identifiers.  Location: Patient: Home Provider: [facility]   I discussed the limitations, risks, security and privacy concerns of performing an evaluation and management service by telephone and the availability of in person appointments. I also discussed with the patient that there may be a patient responsible charge related to this service. The patient expressed understanding and agreed to proceed.   History of Present Illness: I called and spoke with Stephanie Stokes today.  She is no longer having any diarrhea.  However, she says that she is not feeling good because of some intermittent problems with abdominal cramps, nausea and one recent episode of vomiting.  She is not having any drainage from her buttock incisions.  She does not have any lower back pain.  She has not had any fever.   Observations/Objective: C. difficile toxin PCR negative  Assessment and Plan: She is having some mild gastrointestinal distress most likely due to her amoxicillin clavulanate that she is taking for her smoldering buttock abscess and early sacral osteomyelitis.  Follow Up Instructions: Continue amoxicillin clavulanate Follow-up for repeat lab work in 1 month   I discussed the assessment and treatment plan with the patient. The patient was provided an opportunity to ask questions and all were answered. The patient agreed with the plan and demonstrated an understanding of the instructions.   The patient was advised to call back or seek an in-person evaluation if the symptoms worsen or if the condition fails to improve as anticipated.  I provided 14 minutes of non-face-to-face time during this encounter.   Cliffton Asters, MD

## 2021-01-25 ENCOUNTER — Telehealth: Payer: Self-pay

## 2021-01-25 NOTE — Telephone Encounter (Signed)
Returned her call. She is requesting that the office ask Dr. Cliffton Asters, she sees him on 8/17 to do the lab work that Dr. Bertis Ruddy needs for 8/19 appt.

## 2021-01-25 NOTE — Telephone Encounter (Signed)
Patient is due to have a follow up and labs on 8/19 with oncology but her appointment will be a telephone visit with them. Patient would like to know if we can order the labs here that oncology would like her to have drawn, so she does not have to be stuck more than once. Oncology will review the labs with her on 02/18/21 over the phone. I will contact Steward Drone [phone] oncology if you are ok ordering the labs.  Looks like they would like: Reticulocytes CBC w/Diff/platelet Ferritin Iron and TIBC

## 2021-01-25 NOTE — Telephone Encounter (Addendum)
Called Dr. Blair Dolphin office and ask if they could do labs that Dr. Bertis Ruddy needs on 8/17. Office staff will ask Dr. Orvan Falconer and call the office back.

## 2021-01-26 ENCOUNTER — Other Ambulatory Visit: Payer: Self-pay

## 2021-01-26 DIAGNOSIS — D539 Nutritional anemia, unspecified: Secondary | ICD-10-CM

## 2021-01-26 NOTE — Telephone Encounter (Signed)
I spoke with the patient to let her know that labs that oncology can be drawn here at our office on the day of her visit. Labs that oncology will need have been ordered under Dr. Orvan Falconer.  I have also sent a Epic message to Steward Drone, RN with oncology to let her know patient will have the labs drawn here. Hakeen Shipes T Pricilla Loveless

## 2021-01-27 ENCOUNTER — Other Ambulatory Visit: Payer: Self-pay | Admitting: Hematology and Oncology

## 2021-01-27 NOTE — Telephone Encounter (Signed)
I need CBC, iron, ferritin, TIBC, retic

## 2021-01-27 NOTE — Telephone Encounter (Signed)
Received a message from Dr. Blair Dolphin office they will do labs needed by Dr. Bertis Ruddy on 8/17.  FYI

## 2021-01-31 ENCOUNTER — Other Ambulatory Visit: Payer: Self-pay | Admitting: *Deleted

## 2021-01-31 NOTE — Progress Notes (Signed)
Patient called to see if her medication was name-brand or generic, as her copay is $17/month. She wanted to see if she could get it in generic form, but then changed her mind and wanted to stay on what the doctor had prescribed initially. Andree Coss, RN

## 2021-02-16 ENCOUNTER — Other Ambulatory Visit: Payer: 59

## 2021-02-16 ENCOUNTER — Ambulatory Visit: Payer: Medicare Other | Admitting: Internal Medicine

## 2021-02-18 ENCOUNTER — Inpatient Hospital Stay: Payer: Medicare Other | Admitting: Hematology and Oncology

## 2021-02-24 ENCOUNTER — Ambulatory Visit (INDEPENDENT_AMBULATORY_CARE_PROVIDER_SITE_OTHER): Payer: Medicare Other | Admitting: Internal Medicine

## 2021-02-24 ENCOUNTER — Encounter: Payer: Self-pay | Admitting: Internal Medicine

## 2021-02-24 ENCOUNTER — Other Ambulatory Visit: Payer: Self-pay

## 2021-02-24 DIAGNOSIS — M4628 Osteomyelitis of vertebra, sacral and sacrococcygeal region: Secondary | ICD-10-CM | POA: Diagnosis not present

## 2021-02-24 NOTE — Progress Notes (Addendum)
[facility]  Patient Active Problem List   Diagnosis Date Noted   Diarrhea 01/11/2021    Priority: High   Abscess of buttock, right 12/29/2020    Priority: High   Sacral osteomyelitis (HCC) 12/29/2020    Priority: High   Microcytic anemia 01/11/2021   Medication monitoring encounter 12/29/2020   Reactive thrombocytosis 08/16/2020   Deficiency anemia 08/13/2020   Type 2 diabetes mellitus without complication, without long-term current use of insulin (HCC) 08/13/2020   Status post surgery 07/25/2020   PTSD (post-traumatic stress disorder) 07/19/2018   GAD (generalized anxiety disorder) 07/19/2018    Patient's Medications  New Prescriptions   No medications on file  Previous Medications   ACETAMINOPHEN (TYLENOL) 500 MG TABLET    Take 2 tablets (1,000 mg total) by mouth every 6 (six) hours as needed for mild pain or fever.   AMOXICILLIN-CLAVULANATE (AUGMENTIN) 875-125 MG TABLET    Take 1 tablet by mouth 2 (two) times daily.   ATORVASTATIN (LIPITOR) 20 MG TABLET    Take 20 mg by mouth at bedtime.   LANCETS (ONETOUCH DELICA PLUS LANCET33G) MISC    Apply 1 each topically daily.   LISINOPRIL (ZESTRIL) 2.5 MG TABLET    Take 2.5 mg by mouth at bedtime.   LORATADINE (CLARITIN) 10 MG TABLET    Take 10 mg by mouth daily as needed for allergies or rhinitis.   METFORMIN (GLUCOPHAGE-XR) 500 MG 24 HR TABLET    Take 1 tablet (500 mg total) by mouth in the morning and at bedtime.   ONETOUCH VERIO TEST STRIP    1 each daily.   SERTRALINE (ZOLOFT) 100 MG TABLET    Take 2 tablets (200 mg total) by mouth daily.  Modified Medications   No medications on file  Discontinued Medications   No medications on file    Subjective: Cola is in with her husband for routine follow-up.  She has now completed 7 weeks of oral amoxicillin clavulanate for her right antibiotic abscess and early sacral osteomyelitis.  She has been taking a probiotic and has not had any further  problems with nausea, vomiting diarrhea.  Her wound has healed.  She says that she is continue to be under a great deal of stress recently.  August is the anniversary of the suicide death of her mother-in-law and also the suicide death of one of her daughters.  She entered back into counseling recently and has found that helpful.  Review of Systems: Review of Systems  Constitutional:  Negative for fever.  Gastrointestinal:  Negative for abdominal pain, diarrhea, nausea and vomiting.  Musculoskeletal:  Negative for back pain and joint pain.   Past Medical History:  Diagnosis Date   Anal fissure    Dr. Loreta Ave   Colon polyp    Diabetes mellitus without complication (HCC)    diet and exercise controlled    Social History   Tobacco Use   Smoking status: Never   Smokeless tobacco: Never  Substance Use Topics   Alcohol use: No   Drug use: No    Family History  Problem Relation Age of Onset   Diabetes Sister    Osteoarthritis Sister    Breast cancer Maternal Aunt    Cancer Maternal Grandfather    Heart failure Paternal Grandfather     Allergies  Allergen Reactions   Cephalexin Rash    Objective: Vitals:   02/24/21 0834  BP: 134/83  Pulse: 89  Temp: 97.8 F (36.6 C)  TempSrc: Oral   There is no height or weight on file to calculate BMI.  Physical Exam Constitutional:      Comments: She became tearful and very emotional when talking about her daughter.  Cardiovascular:     Rate and Rhythm: Normal rate and regular rhythm.     Heart sounds: No murmur heard. Pulmonary:     Effort: Pulmonary effort is normal.     Breath sounds: Normal breath sounds.  Musculoskeletal:     Comments: She has healed scars on her right buttock.  There is no unusual erythema, swelling or drainage.    Lab Results Sed Rate  Date Value  02/24/2021 14 mm/h  12/28/2020 65 mm/h (H)  11/15/2020 85 mm/hr (H)   CRP (mg/L)  Date Value  02/24/2021 3.6  12/28/2020 59.6 (H)      Problem  List Items Addressed This Visit       High   Sacral osteomyelitis (HCC)    I told them that the only true test of cure for this type of infection is to treat for as long as we think is reasonable then stop and see what happens.  I think that there is a very good chance that her infection has been cured.  I will repeat her inflammatory markers today and call her when I have those results to make a final decision about optimal duration of treatment.  Addendum:  Sed Rate  Date Value  02/24/2021 14 mm/h  12/28/2020 65 mm/h (H)  11/15/2020 85 mm/hr (H)   CRP (mg/L)  Date Value  02/24/2021 3.6  12/28/2020 59.6 (H)   Ms. Stephanie Stokes's inflammatory markers have returned to normal.  I called her today and recommended that she stop amoxicillin clavulanate now.  She is in agreement with that plan.  She will follow-up with me on 04/06/2021.  Incidentally, she noted that she is recently developed severe right hand pain.  She says that since she retired recently she has been reorganizing her house and moving some rather heavy boxes.  She is not sure if she injured her hand.  She says that overnight, last night her pain was 10+ out of 10.  It did not improve with herbal tea, acetaminophen, to oxycodone, a warm shower or a heating pad.  She tells me that the pain was getting worse this morning.  She has not noted any unusual redness or swelling.  I suggested that she call Dr. Shellia Cleverly, her PCP.      Relevant Orders   C-reactive protein (Completed)   Sedimentation rate (Completed)     Cliffton Asters, MD [facility] [phone] pager   [phone] cell 03/01/2021, 10:14 AM

## 2021-02-24 NOTE — Assessment & Plan Note (Addendum)
I told them that the only true test of cure for this type of infection is to treat for as long as we think is reasonable then stop and see what happens.  I think that there is a very good chance that her infection has been cured.  I will repeat her inflammatory markers today and call her when I have those results to make a final decision about optimal duration of treatment.  Addendum:  Sed Rate  Date Value  02/24/2021 14 mm/h  12/28/2020 65 mm/h (H)  11/15/2020 85 mm/hr (H)   CRP (mg/L)  Date Value  02/24/2021 3.6  12/28/2020 59.6 (H)   Stephanie Stokes's inflammatory markers have returned to normal.  I called her today and recommended that she stop amoxicillin clavulanate now.  She is in agreement with that plan.  She will follow-up with me on 04/06/2021.  Incidentally, she noted that she is recently developed severe right hand pain.  She says that since she retired recently she has been reorganizing her house and moving some rather heavy boxes.  She is not sure if she injured her hand.  She says that overnight, last night her pain was 10+ out of 10.  It did not improve with herbal tea, acetaminophen, to oxycodone, a warm shower or a heating pad.  She tells me that the pain was getting worse this morning.  She has not noted any unusual redness or swelling.  I suggested that she call Dr. Shellia Cleverly, her PCP.

## 2021-02-25 ENCOUNTER — Telehealth: Payer: Self-pay

## 2021-02-25 LAB — C-REACTIVE PROTEIN: CRP: 3.6 mg/L (ref <8.0)

## 2021-02-25 LAB — SEDIMENTATION RATE: Sed Rate: 14 mm/h (ref 0–30)

## 2021-02-25 NOTE — Telephone Encounter (Signed)
Patient left voicemail stating she believes she has thrush. RN attempted to call to assess symptoms but wasn't able to reach her. Left voicemail requesting call back to discuss. Patient was seen yesterday 8/25 by Dr. Richardine Service, RN

## 2021-02-25 NOTE — Telephone Encounter (Signed)
Followed up with patient who reports that she noticed a sore on her lip after waking up from a nap as well as a "plaque" on the side of her mouth yesterday after appointment. Does not report a sore throat (states it feels dry and attributes it to Post COVID cough); used hydrogen peroxide as a rinse (RN advised to stop and use warm salt water instead). Does not see any white spots in her mouth.   RN advised that if she doesn't have response from provider by end of day today she will follow up on Monday am.  Rosanna Randy, RN

## 2021-02-28 NOTE — Telephone Encounter (Signed)
RN followed up with patient to inquire about mouth sores. Patient reports using warm salt water to gargle and probiotics and reports significant improvement in sores. Does not need anything from provider at this time.   Alayiah Fontes [employer] Mast, RN

## 2021-03-01 ENCOUNTER — Telehealth: Payer: Self-pay

## 2021-03-01 NOTE — Telephone Encounter (Signed)
Called patient to offer earlier appointment, no answer. Left HIPAA compliant voicemail requesting callback.   Maurita Havener D Kristalyn Bergstresser, RN  

## 2021-03-01 NOTE — Telephone Encounter (Signed)
-----   Message from Odette Fraction, MD sent at 03/01/2021  7:36 AM EDT ----- Regarding: Follow up Patient following Dr Orvan Falconer. Complaining of hand pain, used analgesics and conservative measures at home and did not improve. She would like to see Dr Orvan Falconer as soon as possible. Thanks

## 2021-03-01 NOTE — Telephone Encounter (Signed)
Attempted to call patient regarding message from Dr. Elinor Parkinson. Not able to reach her at this time. Voicemail is not setup. Will forward message to triage incase patient returns missed call. Juanita Laster, RMA

## 2021-03-11 ENCOUNTER — Inpatient Hospital Stay: Payer: Medicare Other | Attending: Hematology and Oncology | Admitting: Hematology and Oncology

## 2021-03-11 DIAGNOSIS — D539 Nutritional anemia, unspecified: Secondary | ICD-10-CM

## 2021-03-11 NOTE — Progress Notes (Signed)
The patient is supposed to get blood work done prior to today's visit but it was not done She said she was feeling fine and was surprised that her labs were not drawn by infectious disease team After much discussion, she will talk to her infectious disease team next month and get her labs drawn then We will set up virtual visit after her labs were drawn next month for further discussion about plan of care She is in agreement No charge to the patient today

## 2021-04-06 ENCOUNTER — Other Ambulatory Visit: Payer: Self-pay

## 2021-04-06 ENCOUNTER — Ambulatory Visit (INDEPENDENT_AMBULATORY_CARE_PROVIDER_SITE_OTHER): Payer: Medicare Other | Admitting: Internal Medicine

## 2021-04-06 ENCOUNTER — Encounter: Payer: Self-pay | Admitting: Internal Medicine

## 2021-04-06 DIAGNOSIS — M4628 Osteomyelitis of vertebra, sacral and sacrococcygeal region: Secondary | ICD-10-CM | POA: Diagnosis not present

## 2021-04-06 NOTE — Progress Notes (Signed)
Virtual Visit via Telephone Note  I connected with Stephanie Stokes on 04/06/21 at 11:15 AM EDT by telephone and verified that I am speaking with the correct person using two identifiers.  Location: Patient: Home Provider: [facility]   I discussed the limitations, risks, security and privacy concerns of performing an evaluation and management service by telephone and the availability of in person appointments. I also discussed with the patient that there may be a patient responsible charge related to this service. The patient expressed understanding and agreed to proceed.   History of Present Illness: I called and spoke with Stephanie Stokes today.  She finished 7 weeks of oral amoxicillin clavulanate on 02/24/2021 as treatment for her chronic sacral wound complicated by osteomyelitis.  Her wound had closed at the time of treatment and her inflammatory markers had normalized.  She states that she is feeling even better now.  Her sacral scar is softening and there has been no new changes to suggest relapse of her infection.   Observations/Objective: Sed Rate  Date Value  02/24/2021 14 mm/h  12/28/2020 65 mm/h (H)  11/15/2020 85 mm/hr (H)   CRP (mg/L)  Date Value  02/24/2021 3.6  12/28/2020 59.6 (H)        Assessment and Plan: I believe that her sacral osteomyelitis has been cured.  Follow Up Instructions: She can follow-up here as needed.   I discussed the assessment and treatment plan with the patient. The patient was provided an opportunity to ask questions and all were answered. The patient agreed with the plan and demonstrated an understanding of the instructions.   The patient was advised to call back or seek an in-person evaluation if the symptoms worsen or if the condition fails to improve as anticipated.  I provided 14 minutes of non-face-to-face time during this encounter.   Stephanie Asters, MD

## 2021-06-22 ENCOUNTER — Ambulatory Visit (INDEPENDENT_AMBULATORY_CARE_PROVIDER_SITE_OTHER)
Admission: EM | Admit: 2021-06-22 | Discharge: 2021-06-22 | Disposition: A | Discharge status: Home / Self Care | Payer: Medicare Other | Source: Home / Self Care

## 2021-06-22 ENCOUNTER — Emergency Department (HOSPITAL_COMMUNITY): Payer: Medicare Other

## 2021-06-22 ENCOUNTER — Emergency Department (HOSPITAL_COMMUNITY)
Admission: EM | Admit: 2021-06-22 | Discharge: 2021-06-22 | Disposition: A | Discharge status: Home / Self Care | Payer: Medicare Other | Attending: Emergency Medicine | Admitting: Emergency Medicine

## 2021-06-22 ENCOUNTER — Other Ambulatory Visit: Payer: Self-pay

## 2021-06-22 DIAGNOSIS — R079 Chest pain, unspecified: Secondary | ICD-10-CM | POA: Insufficient documentation

## 2021-06-22 DIAGNOSIS — R0789 Other chest pain: Secondary | ICD-10-CM | POA: Insufficient documentation

## 2021-06-22 DIAGNOSIS — M549 Dorsalgia, unspecified: Secondary | ICD-10-CM | POA: Insufficient documentation

## 2021-06-22 DIAGNOSIS — Z7984 Long term (current) use of oral hypoglycemic drugs: Secondary | ICD-10-CM | POA: Diagnosis not present

## 2021-06-22 DIAGNOSIS — Z79899 Other long term (current) drug therapy: Secondary | ICD-10-CM | POA: Insufficient documentation

## 2021-06-22 DIAGNOSIS — I10 Essential (primary) hypertension: Secondary | ICD-10-CM | POA: Diagnosis not present

## 2021-06-22 DIAGNOSIS — E119 Type 2 diabetes mellitus without complications: Secondary | ICD-10-CM | POA: Diagnosis not present

## 2021-06-22 HISTORY — DX: Essential (primary) hypertension: I10

## 2021-06-22 LAB — CBC
HCT: 34 % — ABNORMAL LOW (ref 36.0–46.0)
Hemoglobin: 10.9 g/dL — ABNORMAL LOW (ref 12.0–15.0)
MCH: 27.8 pg (ref 26.0–34.0)
MCHC: 32.1 g/dL (ref 30.0–36.0)
MCV: 86.7 fL (ref 80.0–100.0)
Platelets: 266 10*3/uL (ref 150–400)
RBC: 3.92 MIL/uL (ref 3.87–5.11)
RDW: 13.5 % (ref 11.5–15.5)
WBC: 4.9 10*3/uL (ref 4.0–10.5)
nRBC: 0 % (ref 0.0–0.2)

## 2021-06-22 LAB — TROPONIN I (HIGH SENSITIVITY)
Troponin I (High Sensitivity): 4 ng/L (ref <18)
Troponin I (High Sensitivity): 5 ng/L (ref <18)

## 2021-06-22 LAB — BASIC METABOLIC PANEL
Anion gap: 8 (ref 5–15)
BUN: 23 mg/dL (ref 8–23)
CO2: 26 mmol/L (ref 22–32)
Calcium: 9.7 mg/dL (ref 8.9–10.3)
Chloride: 103 mmol/L (ref 98–111)
Creatinine, Ser: 1.07 mg/dL — ABNORMAL HIGH (ref 0.44–1.00)
GFR, Estimated: 58 mL/min — ABNORMAL LOW (ref >60)
Glucose, Bld: 147 mg/dL — ABNORMAL HIGH (ref 70–99)
Potassium: 4.3 mmol/L (ref 3.5–5.1)
Sodium: 137 mmol/L (ref 135–145)

## 2021-06-22 NOTE — ED Provider Notes (Signed)
[facility]    CSN: [id number] Arrival date & time: 06/22/21  1452      History   Chief Complaint Chief Complaint  Patient presents with   Chest Pain    HPI Stephanie Stokes is a 65 y.o. female.   Patient presents with chest pain that has been intermittent over the past week.  Patient is not able to adequately characterize pain but reports that it feels like a "pulling sensation" in her chest that goes straight through her chest to her posterior back.  Also having some associated left arm numbness and pain.  Denies any shortness of breath, headaches, dizziness, nausea, vomiting, blurred vision.  Patient reports that she had a video visit with [facility] physicians today and they wanted her to be seen in person.  Pertinent medical history includes diabetes and high blood pressure.   Chest Pain  Past Medical History:  Diagnosis Date   Anal fissure    Dr. Loreta Ave   Colon polyp    Diabetes mellitus without complication (HCC)    diet and exercise controlled   Hyperlipidemia    Hypertension     Patient Active Problem List   Diagnosis Date Noted   Diarrhea 01/11/2021   Microcytic anemia 01/11/2021   Abscess of buttock, right 12/29/2020   Sacral osteomyelitis (HCC) 12/29/2020   Medication monitoring encounter 12/29/2020   Reactive thrombocytosis 08/16/2020   Deficiency anemia 08/13/2020   Type 2 diabetes mellitus without complication, without long-term current use of insulin (HCC) 08/13/2020   Status post surgery 07/25/2020   PTSD (post-traumatic stress disorder) 07/19/2018   GAD (generalized anxiety disorder) 07/19/2018    Past Surgical History:  Procedure Laterality Date   ANAL FISSURE REPAIR  10/20/10   Dr. Loreta Ave   CESAREAN SECTION   02/15/79 & 03/31/83   IRRIGATION AND DEBRIDEMENT ABSCESS Right 07/25/2020   Procedure: IRRIGATION AND DEBRIDEMENT ABSCESS;  Surgeon: Berna Bue, MD;  Location: [facility] OR;  Service: General;  Laterality: Right;   PELVIC LAPAROSCOPY   02/22/79   lysis of adhesions   TONSILLECTOMY  age 59    OB History     Gravida  2   Para  2   Term  2   Preterm      AB      Living  2      SAB      IAB      Ectopic      Multiple      Live Births               Home Medications    Prior to Admission medications   Medication Sig Start Date End Date Taking? Authorizing Provider  acetaminophen (TYLENOL) 500 MG tablet Take 2 tablets (1,000 mg total) by mouth every 6 (six) hours as needed for mild pain or fever. Patient not taking: Reported on 04/06/2021 07/28/20   Juliet Rude, PA-C  amoxicillin-clavulanate (AUGMENTIN) 875-125 MG tablet Take 1 tablet by mouth 2 (two) times daily. Patient not taking: Reported on 04/06/2021 01/15/21   [provider]  atorvastatin (LIPITOR) 20 MG tablet Take 20 mg by mouth at bedtime. 03/17/20   [provider]  Lancets (ONETOUCH DELICA PLUS [place]) MISC Apply 1 each topically daily. 02/20/20   [provider]  lisinopril (ZESTRIL) 2.5 MG tablet Take 2.5 mg by mouth at bedtime. 02/01/20   [provider]  loratadine (CLARITIN) 10 MG tablet Take 10 mg by mouth daily as needed for allergies or  rhinitis.    [provider]  metFORMIN (GLUCOPHAGE-XR) 500 MG 24 hr tablet Take 1 tablet (500 mg total) by mouth in the morning and at bedtime. Patient taking differently: Take 500 mg by mouth. 2 tabs am ,   2 tabs  pm 07/28/20   Juliet Rude, PA-C  [facility] VERIO test strip 1 each daily. 03/08/20   [provider]  sertraline (ZOLOFT) 100 MG tablet Take 2 tablets (200 mg total) by mouth daily. 10/27/20   Cottle, Steva Ready., MD    Family History Family History  Problem Relation Age of Onset   Diabetes Sister    Osteoarthritis Sister    Breast cancer Maternal Aunt    Cancer Maternal Grandfather    Heart failure Paternal Grandfather     Social History Social History   Tobacco Use   Smoking status: Never   Smokeless tobacco: Never   Substance Use Topics   Alcohol use: No   Drug use: No     Allergies   Kiwi extract and Cephalexin   Review of Systems Review of Systems Per HPI  Physical Exam Triage Vital Signs ED Triage Vitals  Enc Vitals Group     BP 06/22/21 1501 113/73     Pulse Rate 06/22/21 1501 90     Resp 06/22/21 1501 18     Temp 06/22/21 1501 97.9 F (36.6 C)     Temp Source 06/22/21 1501 Oral     SpO2 06/22/21 1501 98 %     Weight --      Height --      Head Circumference --      Peak Flow --      Pain Score 06/22/21 1503 0     Pain Loc --      Pain Edu? --      Excl. in GC? --    No data found.  Updated Vital Signs BP 113/73 (BP Location: Left Arm)    Pulse 90    Temp 97.9 F (36.6 C) (Oral)    Resp 18    SpO2 98%   Visual Acuity Right Eye Distance:   Left Eye Distance:   Bilateral Distance:    Right Eye Near:   Left Eye Near:    Bilateral Near:     Physical Exam Constitutional:      General: She is not in acute distress.    Appearance: Normal appearance. She is not toxic-appearing or diaphoretic.  HENT:     Head: Normocephalic and atraumatic.  Eyes:     Extraocular Movements: Extraocular movements intact.     Conjunctiva/sclera: Conjunctivae normal.  Cardiovascular:     Rate and Rhythm: Normal rate and regular rhythm.     Pulses: Normal pulses.     Heart sounds: Normal heart sounds.  Pulmonary:     Effort: Pulmonary effort is normal. No respiratory distress.     Breath sounds: Normal breath sounds.  Chest:     Chest wall: No tenderness.  Musculoskeletal:     Cervical back: Normal.     Thoracic back: Normal.     Lumbar back: Normal.     Comments: No tenderness to palpation to entirety of back.  No tenderness to palpation to left arm.  Has full range of motion with no pain with range of motion.  Neurovascular intact.  Neurological:     General: No focal deficit present.     Mental Status: She is alert and oriented to person, place,  and time. Mental status is at  baseline.  Psychiatric:        Mood and Affect: Mood normal.        Behavior: Behavior normal.        Thought Content: Thought content normal.        Judgment: Judgment normal.     UC Treatments / Results  Labs (all labs ordered are listed, but only abnormal results are displayed) Labs Reviewed - No data to display  EKG   Radiology No results found.  Procedures Procedures (including critical care time)  Medications Ordered in UC Medications - No data to display  Initial Impression / Assessment and Plan / UC Course  I have reviewed the triage vital signs and the nursing notes.  Pertinent labs & imaging results that were available during my care of the patient were reviewed by me and considered in my medical decision making (see chart for details).     EKG showing normal sinus rhythm.  Although, patient's chest pain will need further evaluation and management at the hospital as it does not appear that it is musculoskeletal in origin given that that it is not reproducible with palpation.  Patient advised go to the hospital for further evaluation and management.  Recommended EMS transport but patient declined.  Risks associated with not going by EMS were discussed with patient.  Patient left via self transport. Final Clinical Impressions(s) / UC Diagnoses   Final diagnoses:  Other chest pain     Discharge Instructions      Please go the emergency department as soon as you leave urgent care for further evaluation and management.    ED Prescriptions   None    PDMP not reviewed this encounter.   Gustavus Bryant, [place] 06/22/21 1536

## 2021-06-22 NOTE — ED Provider Notes (Signed)
Emergency Medicine Provider Triage Evaluation Note  Stephanie Stokes , a 65 y.o. female  was evaluated in triage.  Pt complains of chest discomfort.  Symptoms have been intermittent over the past week.  She describes a tightness in her chest that moves through to her back.  No associated vomiting or diaphoresis.  She has a history of diabetes and high cholesterol.  Remote stress test, no previous ACS or CAD that is known.  She states that symptoms do seem to get worse when she exerts herself, such as walking up a flight of stairs.  Review of Systems  Positive: Chest discomfort Negative: Vomiting  Physical Exam  BP 119/81 (BP Location: Left Arm)    Pulse 89    Temp (!) 97.4 F (36.3 C) (Oral)    Resp 16    Ht 4\' 11"  (1.499 m)    Wt 59 kg    SpO2 98%    BMI 26.26 kg/m  Gen:   Awake, no distress   Resp:  Normal effort  MSK:   Moves extremities without difficulty  Other:  Cardiac regular rate and rhythm  Medical Decision Making  Medically screening exam initiated at 4:07 PM.  Appropriate orders placed.  Samatha Anspach was informed that the remainder of the evaluation will be completed by another provider, this initial triage assessment does not replace that evaluation, and the importance of remaining in the ED until their evaluation is complete.     Kevin Fenton, PA-C 06/22/21 1609    06/24/21, DO 06/22/21 2227

## 2021-06-22 NOTE — ED Notes (Signed)
Pt walking around the department. Pt has been asked by multiple staff members to stay in the area of her bed as the provider may come when she is elsewhere. Pt seemed annoyed and said its been 20 minutes and continued walking.

## 2021-06-22 NOTE — ED Provider Notes (Signed)
[facility] EMERGENCY DEPARTMENT Provider Note   CSN: [id number] Arrival date & time: 06/22/21  1547     History Chief Complaint  Patient presents with   Chest Pain    Stephanie Stokes is a 65 y.o. female.  Presenting to the emergency room with concern for chest pain.  Patient reports that over the past week she has been having intermittent episodes of chest discomfort, states that it seems to radiate towards her back.  States it was up to 2 or 3 out of 10 in severity, currently does not have any pain or discomfort.  No associated difficulty breathing.  Not associated with exertion.  No obvious alleviating or aggravating factors.  Reports that she contacted her primary care office who recommended she see a provider today so she went to urgent care who referred her to ER.  HPI     Past Medical History:  Diagnosis Date   Anal fissure    Dr. Loreta Ave   Colon polyp    Diabetes mellitus without complication (HCC)    diet and exercise controlled   Hyperlipidemia    Hypertension     Patient Active Problem List   Diagnosis Date Noted   Diarrhea 01/11/2021   Microcytic anemia 01/11/2021   Abscess of buttock, right 12/29/2020   Sacral osteomyelitis (HCC) 12/29/2020   Medication monitoring encounter 12/29/2020   Reactive thrombocytosis 08/16/2020   Deficiency anemia 08/13/2020   Type 2 diabetes mellitus without complication, without long-term current use of insulin (HCC) 08/13/2020   Status post surgery 07/25/2020   PTSD (post-traumatic stress disorder) 07/19/2018   GAD (generalized anxiety disorder) 07/19/2018    Past Surgical History:  Procedure Laterality Date   ANAL FISSURE REPAIR  10/20/10   Dr. Loreta Ave   CESAREAN SECTION   02/15/79 & 03/31/83   IRRIGATION AND DEBRIDEMENT ABSCESS Right 07/25/2020   Procedure: IRRIGATION AND DEBRIDEMENT ABSCESS;  Surgeon: Berna Bue, MD;  Location: [facility] OR;  Service: General;  Laterality: Right;   PELVIC LAPAROSCOPY  02/22/79    lysis of adhesions   TONSILLECTOMY  age 7     OB History     Gravida  2   Para  2   Term  2   Preterm      AB      Living  2      SAB      IAB      Ectopic      Multiple      Live Births              Family History  Problem Relation Age of Onset   Diabetes Sister    Osteoarthritis Sister    Breast cancer Maternal Aunt    Cancer Maternal Grandfather    Heart failure Paternal Grandfather     Social History   Tobacco Use   Smoking status: Never   Smokeless tobacco: Never  Substance Use Topics   Alcohol use: No   Drug use: No    Home Medications Prior to Admission medications   Medication Sig Start Date End Date Taking? Authorizing Provider  atorvastatin (LIPITOR) 20 MG tablet Take 20 mg by mouth at bedtime. 03/17/20  Yes [provider]  lisinopril (ZESTRIL) 2.5 MG tablet Take 2.5 mg by mouth at bedtime. 02/01/20  Yes [provider]  loratadine (CLARITIN) 10 MG tablet Take 10 mg by mouth daily as needed for allergies or rhinitis.   Yes [provider]  metFORMIN (GLUCOPHAGE-XR)  500 MG 24 hr tablet Take 1 tablet (500 mg total) by mouth in the morning and at bedtime. Patient taking differently: Take 500 mg by mouth. 2 tabs am ,   2 tabs  pm 07/28/20  Yes Trixie DeisJohnson, Kelly R, PA-C  sertraline (ZOLOFT) 100 MG tablet Take 2 tablets (200 mg total) by mouth daily. 10/27/20  Yes Cottle, Steva Readyarey G Jr., MD  acetaminophen (TYLENOL) 500 MG tablet Take 2 tablets (1,000 mg total) by mouth every 6 (six) hours as needed for mild pain or fever. Patient not taking: Reported on 04/06/2021 07/28/20   Juliet RudeJohnson, Kelly R, PA-C  amoxicillin-clavulanate (AUGMENTIN) 875-125 MG tablet Take 1 tablet by mouth 2 (two) times daily. Patient not taking: Reported on 04/06/2021 01/15/21   [provider]  Lancets [facility](ONETOUCH DELICA PLUS [place]33G) MISC Apply 1 each topically daily. 02/20/20   [provider]  [facility] test strip 1 each daily. 03/08/20    [provider]    Allergies    Kiwi extract and Cephalexin  Review of Systems   Review of Systems  Constitutional:  Negative for chills and fever.  HENT:  Negative for ear pain and sore throat.   Eyes:  Negative for pain and visual disturbance.  Respiratory:  Negative for cough and shortness of breath.   Cardiovascular:  Positive for chest pain. Negative for palpitations.  Gastrointestinal:  Negative for abdominal pain and vomiting.  Genitourinary:  Negative for dysuria and hematuria.  Musculoskeletal:  Positive for back pain. Negative for arthralgias.  Skin:  Negative for color change and rash.  Neurological:  Negative for seizures and syncope.  All other systems reviewed and are negative.  Physical Exam Updated Vital Signs BP 131/72 (BP Location: Right Arm)    Pulse 75    Temp 97.8 F (36.6 C) (Oral)    Resp 16    Ht 4\' 11"  (1.499 m)    Wt 59 kg    SpO2 100%    BMI 26.26 kg/m   Physical Exam Vitals and nursing note reviewed.  Constitutional:      General: She is not in acute distress.    Appearance: She is well-developed.  HENT:     Head: Normocephalic and atraumatic.  Eyes:     Conjunctiva/sclera: Conjunctivae normal.  Cardiovascular:     Rate and Rhythm: Normal rate and regular rhythm.     Heart sounds: No murmur heard. Pulmonary:     Effort: Pulmonary effort is normal. No respiratory distress.     Breath sounds: Normal breath sounds.  Abdominal:     Palpations: Abdomen is soft.     Tenderness: There is no abdominal tenderness.  Musculoskeletal:        General: No swelling.     Cervical back: Neck supple.     Right lower leg: No edema.     Left lower leg: No edema.  Skin:    General: Skin is warm and dry.     Capillary Refill: Capillary refill takes less than 2 seconds.  Neurological:     Mental Status: She is alert.  Psychiatric:        Mood and Affect: Mood normal.    ED Results / Procedures / Treatments   Labs (all labs ordered are listed,  but only abnormal results are displayed) Labs Reviewed  BASIC METABOLIC PANEL - Abnormal; Notable for the following components:      Result Value   Glucose, Bld 147 (*)    Creatinine, Ser 1.07 (*)  GFR, Estimated 58 (*)    All other components within normal limits  CBC - Abnormal; Notable for the following components:   Hemoglobin 10.9 (*)    HCT 34.0 (*)    All other components within normal limits  TROPONIN I (HIGH SENSITIVITY)  TROPONIN I (HIGH SENSITIVITY)    EKG None  Radiology DG Chest 1 View  Result Date: 06/22/2021 CLINICAL DATA:  Chest pain EXAM: CHEST  1 VIEW COMPARISON:  Chest x-ray dated July 25, 2020 FINDINGS: The heart size and mediastinal contours are within normal limits. Both lungs are clear. The visualized skeletal structures are unremarkable. IMPRESSION: No active disease. Electronically Signed   By: Yetta Glassman M.D.   On: 06/22/2021 16:53    Procedures Procedures   Medications Ordered in ED Medications - No data to display  ED Course  I have reviewed the triage vital signs and the nursing notes.  Pertinent labs & imaging results that were available during my care of the patient were reviewed by me and considered in my medical decision making (see chart for details).    MDM Rules/Calculators/A&P                         65 year old lady presenting to the ER with concern for chest.  Episodes of pain ongoing over the past week but currently asymptomatic.  EKG without acute ischemic change and troponin x2 was within normal limits, CXR negative.  Pain episodes are described as mild discomfort, none reported as severe pain or ripping, no hypertension or hypotension, given her lack of ongoing symptoms and normal vital signs.  I have very low suspicion for acute aortic pathology.  Given the work-up today and her current well appearance and lack of ongoing symptoms, believe she is appropriate for discharge and outpatient management.  Recommend she follow-up  with her primary care doctor, reviewed return precautions and discharged home.    After the discussed management above, the patient was determined to be safe for discharge.  The patient was in agreement with this plan and all questions regarding their care were answered.  ED return precautions were discussed and the patient will return to the ED with any significant worsening of condition.     Final Clinical Impression(s) / ED Diagnoses Final diagnoses:  Chest pain, unspecified type    Rx / DC Orders ED Discharge Orders     None        Lucrezia Starch, MD 06/23/21 1534

## 2021-06-22 NOTE — Discharge Instructions (Signed)
Please go the emergency department as soon as you leave urgent care for further evaluation and management.

## 2021-06-22 NOTE — ED Triage Notes (Signed)
Pt c/o "pulling" sensation and points to lower sternum, also points to associated posterior area directly across, left arm pain.   Onset "about a week ago"

## 2021-06-22 NOTE — ED Triage Notes (Signed)
Pt here POV with c/o chest pain X1 week. Pt states it is a pressure that begins around epigastric area and shoots to back. Thought it was from lifting weights but pain has not gone away. Denies SOB, vomiting.

## 2021-06-22 NOTE — Discharge Instructions (Signed)
Please follow-up with your primary care doctor to discuss your symptoms from today.  If you have worsening chest pain, any difficulty breathing or other new concerning symptom, please return to ER for reassessment.

## 2021-07-08 ENCOUNTER — Ambulatory Visit (INDEPENDENT_AMBULATORY_CARE_PROVIDER_SITE_OTHER): Payer: Medicare Other | Admitting: Internal Medicine

## 2021-07-08 ENCOUNTER — Other Ambulatory Visit: Payer: Self-pay

## 2021-07-08 ENCOUNTER — Encounter: Payer: Self-pay | Admitting: Internal Medicine

## 2021-07-08 VITALS — BP 112/62 | HR 90 | Ht 59.0 in | Wt 142.0 lb

## 2021-07-08 DIAGNOSIS — R0789 Other chest pain: Secondary | ICD-10-CM | POA: Diagnosis not present

## 2021-07-08 NOTE — Progress Notes (Signed)
Cardiology Office Note:    Date:  07/08/2021   ID:  Kevin Fenton, DOB 09/24/1955, MRN [id number]  PCP:  Aliene Beams, MD   [facility] Providers Cardiologist:  Maisie Fus, MD     Referring MD: Aliene Beams, MD   No chief complaint on file. Atypical Chest pain  History of Present Illness:    Stephanie Stokes is a 66 y.o. female with a hx of DM2 A1c 9.2,  GAD, HLD, HTN  She comes in today for evaluation. She reports that she had increased her exercise at the end of November. She was lifting heavier weights. She was having left arm pain and her right arm would have pain. She has upper abdominal pulling. She went to urgent care for these symptoms. She was advised to go to the ER. Stephanie Stokes presented to the ED 06/22/2021 with intermittent 2-3/10 chest pain. She had EKG with no ischemic changes. Troponin were negative x2. She was educated on symptoms of heartache and advised that women can present differently.   Today, she reports some lower back pain. In the AM she had pain in the right arm and left arm. She walks a lot and she uses weights during the day. She runs in place. She is not having chest discomfort or radiation with activity. No dyspnea on exertion.   She has no family hx of heart disease.   She has no prior history of cardiac disease.   CVD Risk/Equivalent: HLD- yes; on atorvastatin 20 mg HTN- yes; small dose lisinopril 2.5 mg  PAD- No DMII -yes, not well controlled per A1c 9.2% 07/25/2020 Smoker-non smoker Stroke-no Premature Family History- No  Labs: TC-158 LDL- 77 HDL- 65 TSH 1.2 Crt 1.0   Past Medical History:  Diagnosis Date   Anal fissure    Dr. Loreta Ave   Colon polyp    Diabetes mellitus without complication (HCC)    diet and exercise controlled   Hyperlipidemia    Hypertension     Past Surgical History:  Procedure Laterality Date   ANAL FISSURE REPAIR  10/20/10   Dr. Loreta Ave   CESAREAN SECTION   02/15/79 & 03/31/83   IRRIGATION AND DEBRIDEMENT  ABSCESS Right 07/25/2020   Procedure: IRRIGATION AND DEBRIDEMENT ABSCESS;  Surgeon: Berna Bue, MD;  Location: [facility] OR;  Service: General;  Laterality: Right;   PELVIC LAPAROSCOPY  02/22/79   lysis of adhesions   TONSILLECTOMY  age 61    Current Medications: Current Meds  Medication Sig   atorvastatin (LIPITOR) 20 MG tablet Take 20 mg by mouth at bedtime.   Lancets (ONETOUCH DELICA PLUS LANCET33G) MISC Apply 1 each topically daily.   lisinopril (ZESTRIL) 2.5 MG tablet Take 2.5 mg by mouth at bedtime.   metFORMIN (GLUCOPHAGE-XR) 500 MG 24 hr tablet Take 1 tablet (500 mg total) by mouth in the morning and at bedtime.   ONETOUCH VERIO test strip 1 each daily.   sertraline (ZOLOFT) 100 MG tablet Take 2 tablets (200 mg total) by mouth daily.     Allergies:   Kiwi extract and Cephalexin   Social History   Socioeconomic History   Marital status: Married    Spouse name: Not on file   Number of children: 2   Years of education: Not on file   Highest education level: Not on file  Occupational History   Occupation: Emergency planning/management officer   Tobacco Use   Smoking status: Never   Smokeless tobacco: Never  Substance and Sexual Activity  Alcohol use: No   Drug use: No   Sexual activity: Not on file  Other Topics Concern   Not on file  Social History Narrative   Daughter Joice Loftsmber who had been diagnosed with Bipolar disease committed suicide on her [age] [address] birthday in 2013.   Social Determinants of Health   Financial Resource Strain: Not on file  Food Insecurity: Not on file  Transportation Needs: Not on file  Physical Activity: Not on file  Stress: Not on file  Social Connections: Not on file     Family History: The patient's family history includes Breast cancer in her maternal aunt; Cancer in her maternal grandfather; Diabetes in her sister; Heart failure in her paternal grandfather; Osteoarthritis in her sister.  ROS:   Please see the history of present illness.     All other  systems reviewed and are negative.  EKGs/Labs/Other Studies Reviewed:    The following studies were reviewed today:   EKG:  EKG is  ordered today.  The ekg ordered today demonstrates  NSR, no ischemic changes  Recent Labs: 07/27/2020: Magnesium 2.9 08/13/2020: ALT 12 06/22/2021: BUN 23; Creatinine, Ser 1.07; Hemoglobin 10.9; Platelets 266; Potassium 4.3; Sodium 137  Recent Lipid Panel No results found for: CHOL, TRIG, HDL, CHOLHDL, VLDL, LDLCALC, LDLDIRECT   Risk Assessment/Calculations:           Physical Exam:    VS:   Vitals:   07/08/21 1325  BP: 112/62  Pulse: 90     Wt Readings from Last 3 Encounters:  07/08/21 142 lb (64.4 kg)  06/22/21 130 lb (59 kg)  01/11/21 129 lb (58.5 kg)     GEN:  Well nourished, well developed in no acute distress HEENT: Normal NECK: No JVD; No carotid bruits LYMPHATICS: No lymphadenopathy CARDIAC: RRR, no murmurs, rubs, gallops RESPIRATORY:  Clear to auscultation without rales, wheezing or rhonchi  ABDOMEN: Soft, non-tender, non-distended MUSCULOSKELETAL:  No edema; No deformity  SKIN: Warm and dry NEUROLOGIC:  Alert and oriented x 3 PSYCHIATRIC:  Normal affect   ASSESSMENT:    #Non cardiac chest pain: Her chest pain does not occur with exertion and no associated SOB. Her risk factors include age and DM2. She is a non smoker. Her probability of significant coronary disease is low. We discussed importance of lowering CVD risk with lowering A1c. She plans to continue her exercise. Following a low fat diet is important as well.  For her atypical symptoms, this is likely MSK related. We discussed that if she were to have acute symptoms including cp, sob or abnormal concerning symptoms to be evaluated.   #HLD- well controlled. LDL at goal. Continue atorvastatin 20 mg daily.  #HTN- very mild. on low dose lisinopril 2.5 mg daily. She would like to a trial off this medication. We discussed that she can check her blood pressure over the  next two weeks and if she is predominantly at a goal blood pressure of <130/80 mmHg then she can remain off of it.  PLAN:    In order of problems listed above:  Follow up PRN       Medication Adjustments/Labs and Tests Ordered: Current medicines are reviewed at length with the patient today.  Concerns regarding medicines are outlined above.  Orders Placed This Encounter  Procedures   EKG 12-Lead   No orders of the defined types were placed in this encounter.   Patient Instructions  Medication Instructions:  No Changes In Medications at this time.  *If you  need a refill on your cardiac medications before your next appointment, please call your pharmacy*  Follow-Up: At [facility], you and your health needs are our priority.  As part of our continuing mission to provide you with exceptional heart care, we have created designated Provider Care Teams.  These Care Teams include your primary Cardiologist (physician) and Advanced Practice Providers (APPs -  Physician Assistants and Nurse Practitioners) who all work together to provide you with the care you need, when you need it.  Your next appointment:   AS NEEDED   The format for your next appointment:   In Person  Provider:   Dr. Wyline Mood      Signed, Maisie Fus, MD  07/08/2021 1:49 PM    [facility]

## 2021-07-08 NOTE — Patient Instructions (Addendum)
Medication Instructions:  °No Changes In Medications at this time.  °*If you need a refill on your cardiac medications before your next appointment, please call your pharmacy* ° °Follow-Up: °At [facility], you and your health needs are our priority.  As part of our continuing mission to provide you with exceptional heart care, we have created designated Provider Care Teams.  These Care Teams include your primary Cardiologist (physician) and Advanced Practice Providers (APPs -  Physician Assistants and Nurse Practitioners) who all work together to provide you with the care you need, when you need it. ° °Your next appointment:   °AS NEEDED  ° °The format for your next appointment:   °In Person ° °Provider:   °Dr. Branch  °  °

## 2021-11-02 ENCOUNTER — Ambulatory Visit: Payer: Medicare Other | Admitting: Allergy

## 2021-11-06 ENCOUNTER — Other Ambulatory Visit: Payer: Self-pay | Admitting: Psychiatry

## 2021-11-06 DIAGNOSIS — F4310 Post-traumatic stress disorder, unspecified: Secondary | ICD-10-CM

## 2021-11-06 DIAGNOSIS — F411 Generalized anxiety disorder: Secondary | ICD-10-CM

## 2021-11-06 DIAGNOSIS — F331 Major depressive disorder, recurrent, moderate: Secondary | ICD-10-CM

## 2021-11-06 NOTE — Telephone Encounter (Signed)
Please call to schedule an appt. Past due.  

## 2021-11-06 NOTE — Telephone Encounter (Signed)
Past due for F/U. Please call to schedule an appt.  ?

## 2021-11-09 NOTE — Telephone Encounter (Signed)
Pt has appt 6/1. She thinks she has enough meds until then or she will call back. ?

## 2021-11-09 NOTE — Telephone Encounter (Signed)
Lvm for pt to call and schedule appt 

## 2021-12-01 ENCOUNTER — Ambulatory Visit (INDEPENDENT_AMBULATORY_CARE_PROVIDER_SITE_OTHER): Payer: Medicare Other | Admitting: Psychiatry

## 2021-12-01 ENCOUNTER — Encounter: Payer: Self-pay | Admitting: Psychiatry

## 2021-12-01 DIAGNOSIS — F331 Major depressive disorder, recurrent, moderate: Secondary | ICD-10-CM | POA: Diagnosis not present

## 2021-12-01 DIAGNOSIS — F411 Generalized anxiety disorder: Secondary | ICD-10-CM

## 2021-12-01 DIAGNOSIS — F4310 Post-traumatic stress disorder, unspecified: Secondary | ICD-10-CM | POA: Diagnosis not present

## 2021-12-01 DIAGNOSIS — F5105 Insomnia due to other mental disorder: Secondary | ICD-10-CM

## 2021-12-01 MED ORDER — SERTRALINE HCL 100 MG PO TABS: 200.0000 mg | ORAL_TABLET | Freq: Every day | ORAL | 3 refills | Status: DC
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
## 2022-01-08 IMAGING — CR DG CHEST 2V
2 series · 2 of 2 positions shown · non-contrast
Comparison: No priors.

CLINICAL DATA: 67-year-old female with history of suspected sepsis.

EXAM:
CHEST - 2 VIEW

[chest lat]
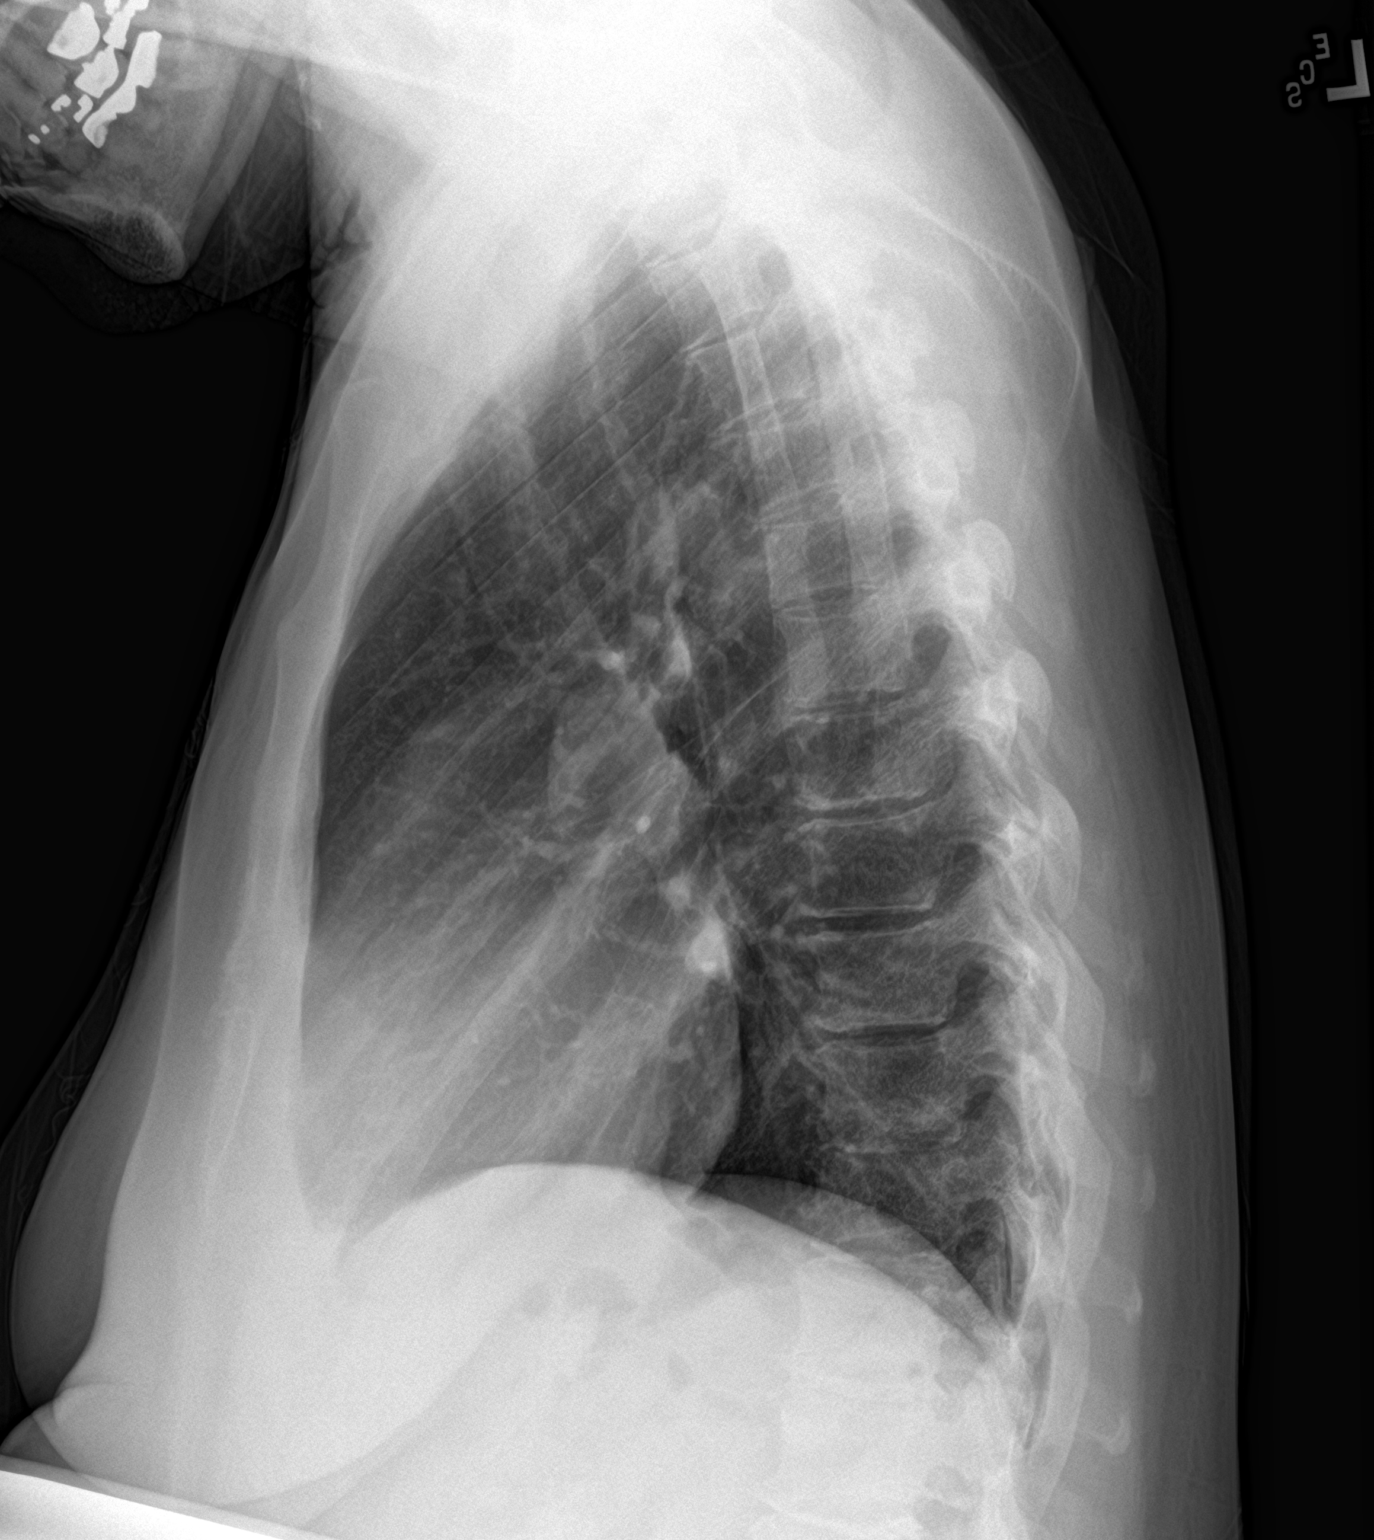

[chest ap]
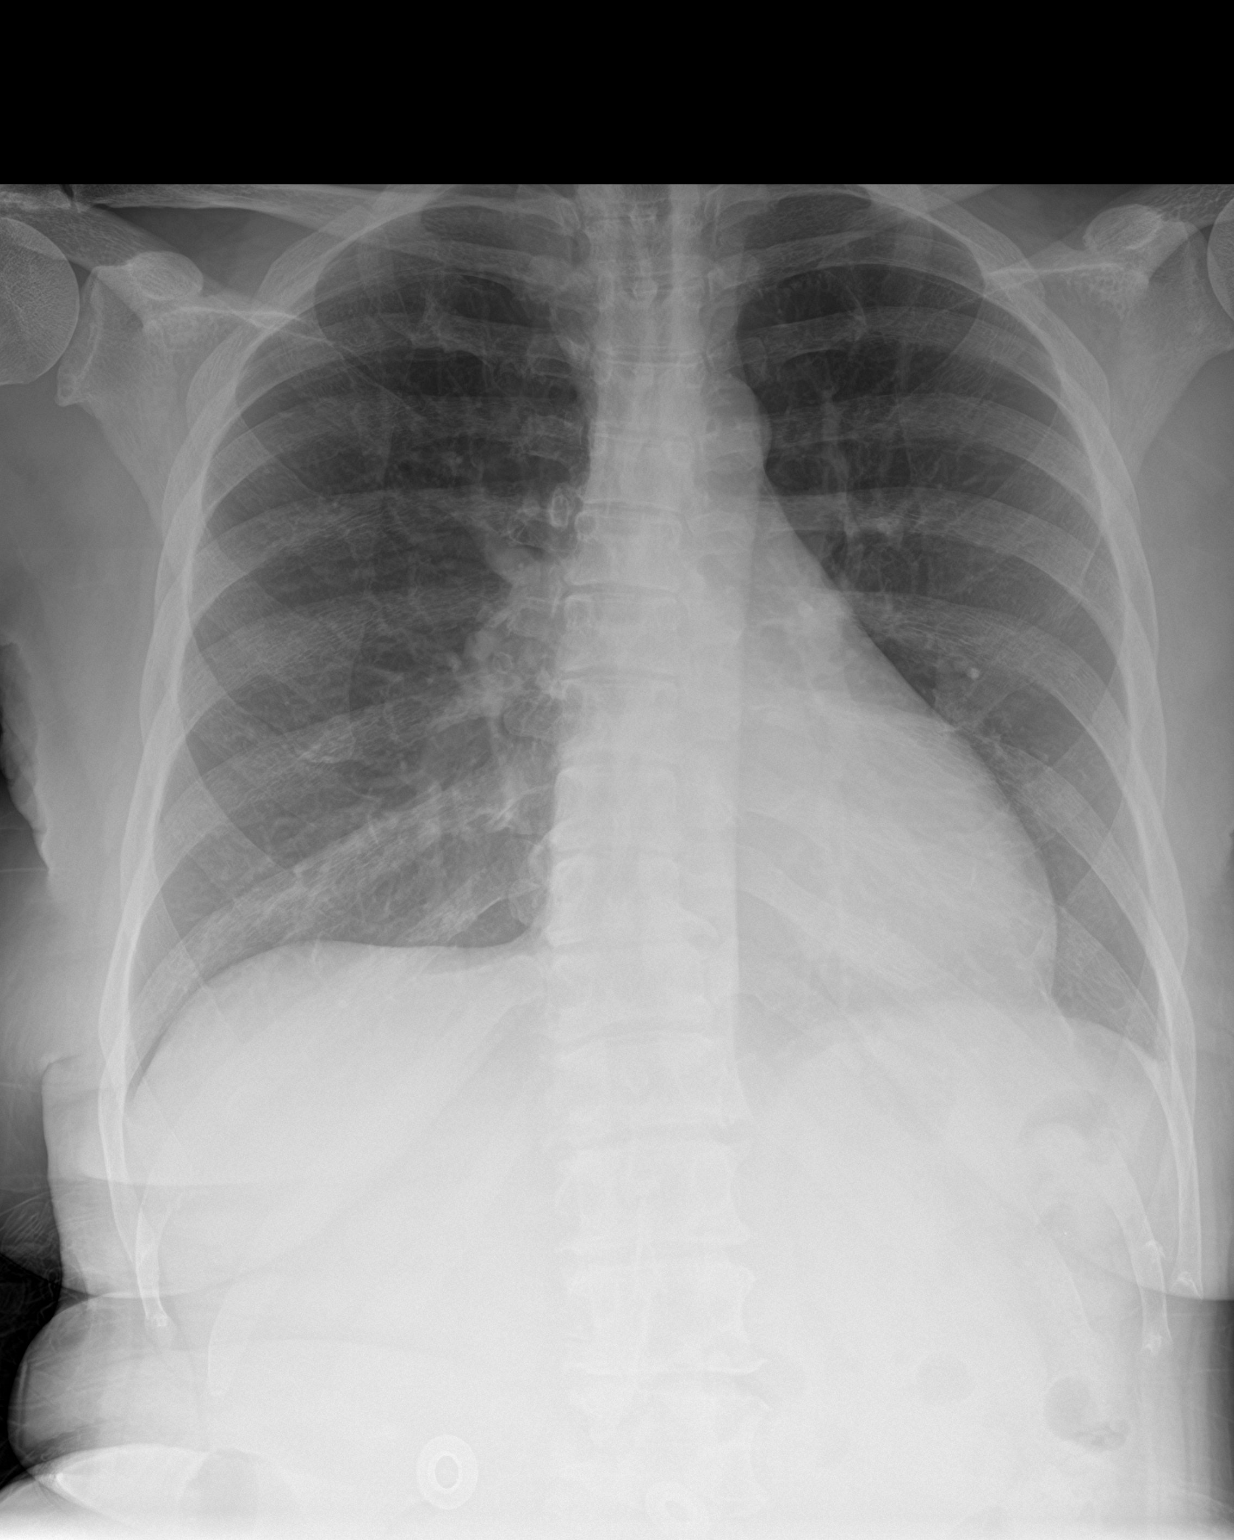

[2 of 2 positions shown; findings below may reference images not displayed]

FINDINGS: Lung volumes are normal. No consolidative airspace disease. No
pleural effusions. No pneumothorax. No pulmonary nodule or mass
noted. Pulmonary vasculature and the cardiomediastinal silhouette
are within normal limits.
IMPRESSION: No radiographic evidence of acute cardiopulmonary disease.

## 2022-06-09 ENCOUNTER — Other Ambulatory Visit: Payer: Self-pay | Admitting: Psychiatry

## 2022-06-09 DIAGNOSIS — F331 Major depressive disorder, recurrent, moderate: Secondary | ICD-10-CM

## 2022-06-09 DIAGNOSIS — F411 Generalized anxiety disorder: Secondary | ICD-10-CM

## 2022-06-09 DIAGNOSIS — F4310 Post-traumatic stress disorder, unspecified: Secondary | ICD-10-CM

## 2022-06-14 ENCOUNTER — Other Ambulatory Visit: Payer: Self-pay

## 2022-06-14 DIAGNOSIS — F331 Major depressive disorder, recurrent, moderate: Secondary | ICD-10-CM

## 2022-06-14 DIAGNOSIS — F4310 Post-traumatic stress disorder, unspecified: Secondary | ICD-10-CM

## 2022-06-14 DIAGNOSIS — F411 Generalized anxiety disorder: Secondary | ICD-10-CM

## 2022-06-14 MED ORDER — SERTRALINE HCL 100 MG PO TABS: 200.0000 mg | ORAL_TABLET | Freq: Every day | ORAL | 0 refills | Status: DC

## 2022-06-14 NOTE — Telephone Encounter (Signed)
Called pharmacy, as I wasn't sure what the issue was. Was told 100 mg dose is on backorder. LVM for patient to [facility] to let her know and ask if she wanted to find another pharmacy.

## 2022-06-23 IMAGING — US US PELVIS LIMITED
1 series · 14 of 17 positions shown · non-contrast
Comparison: 12/17/2020

CLINICAL DATA: 65-year-old female with history of recent pelvic
abscess debridement with concern for persistent fluid collection.

EXAM:
US PELVIS LIMITED
TECHNIQUE: Ultrasound examination of the pelvic soft tissues was performed in
the area of clinical concern.

[Series 1: us fine needle asp 1st lesion · 14 of 17 slices shown]
[im 1/17]
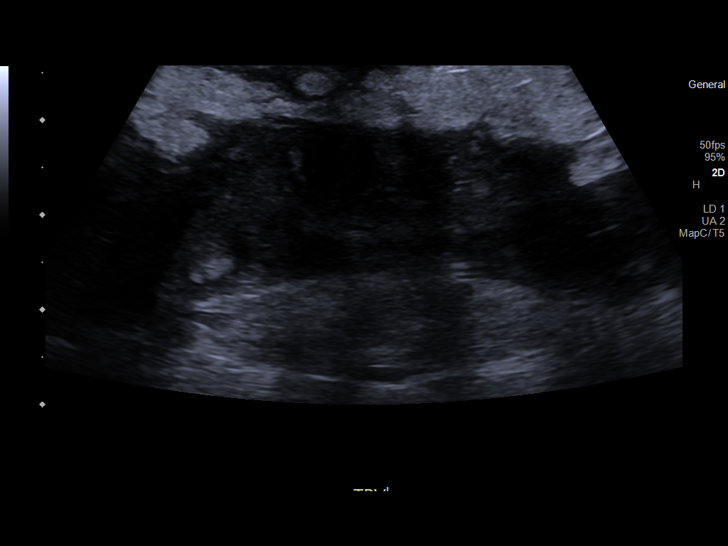
[im 2/17]
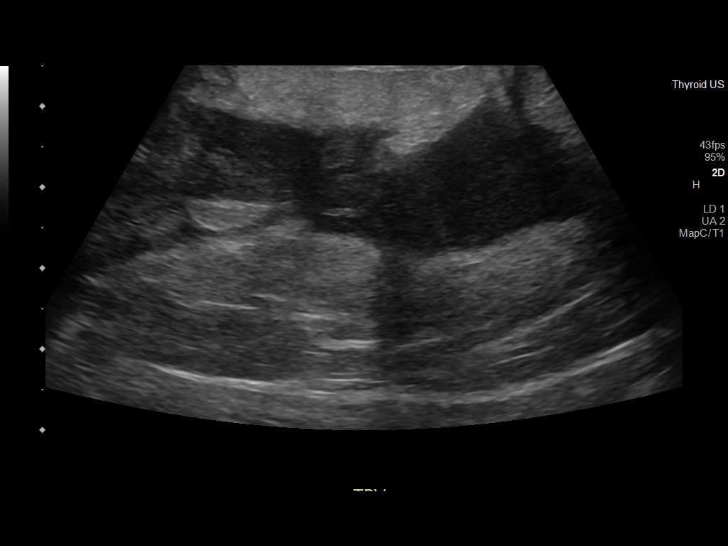
[im 4/17]
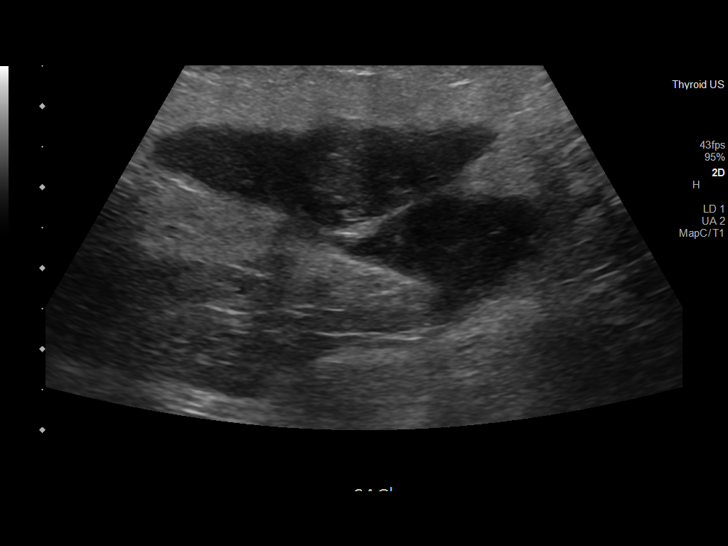
[im 5/17]
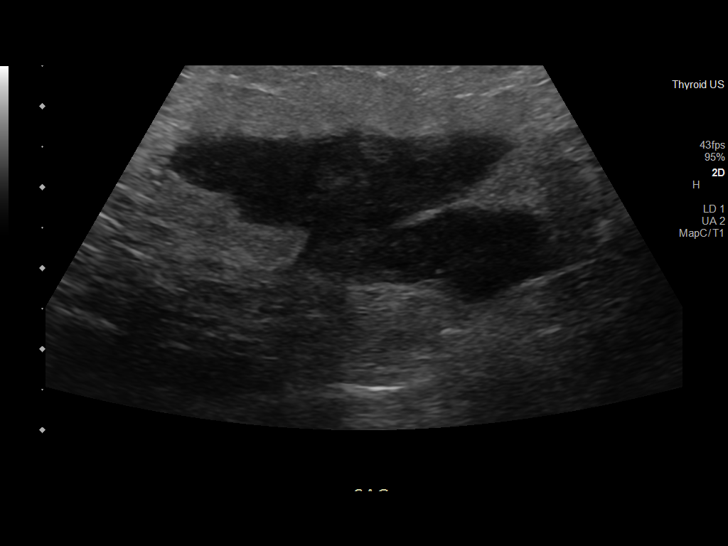
[im 6/17]
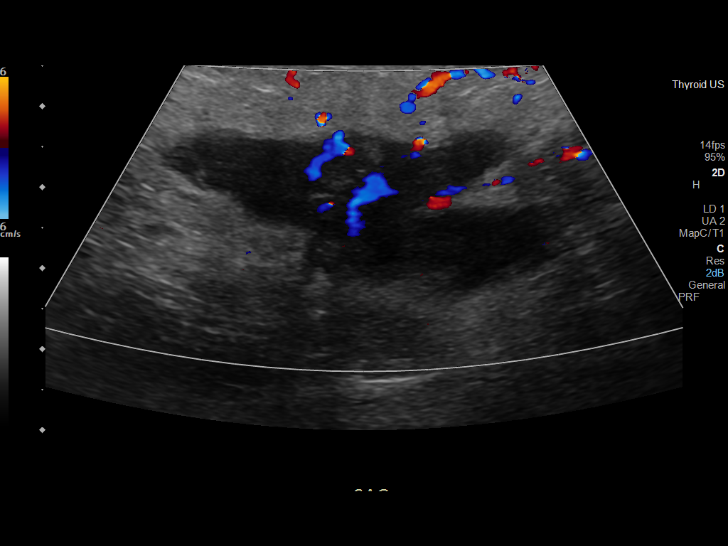
[im 7/17]
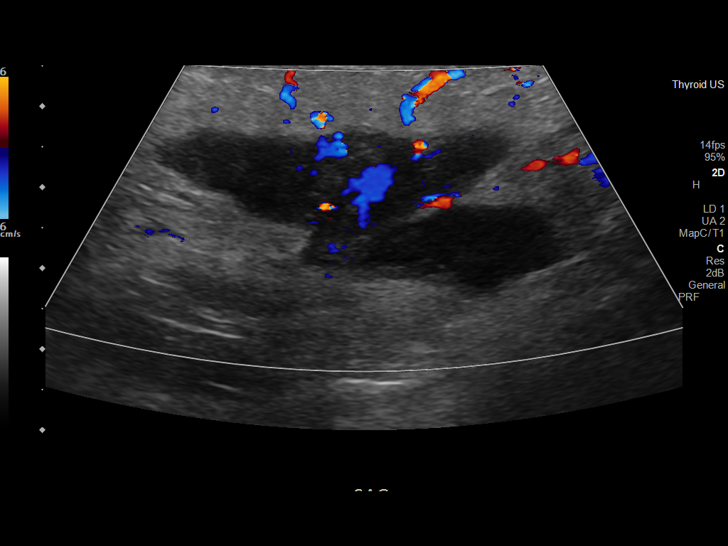
[im 8/17]
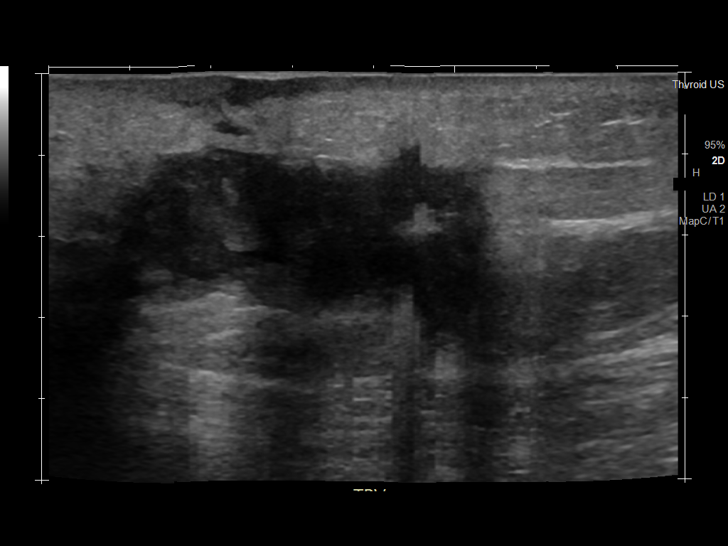
[im 10/17]
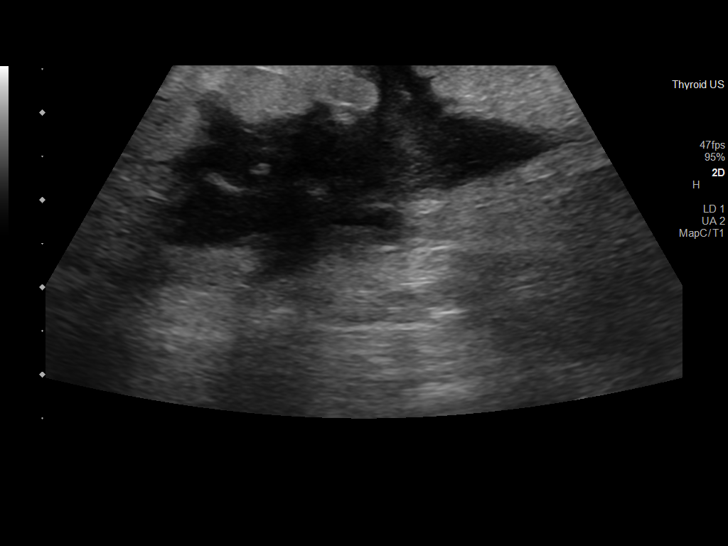
[im 11/17]
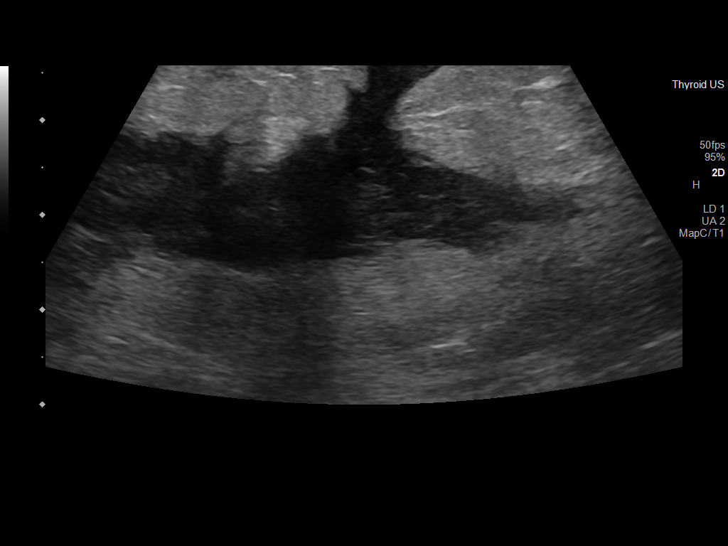
[im 12/17]
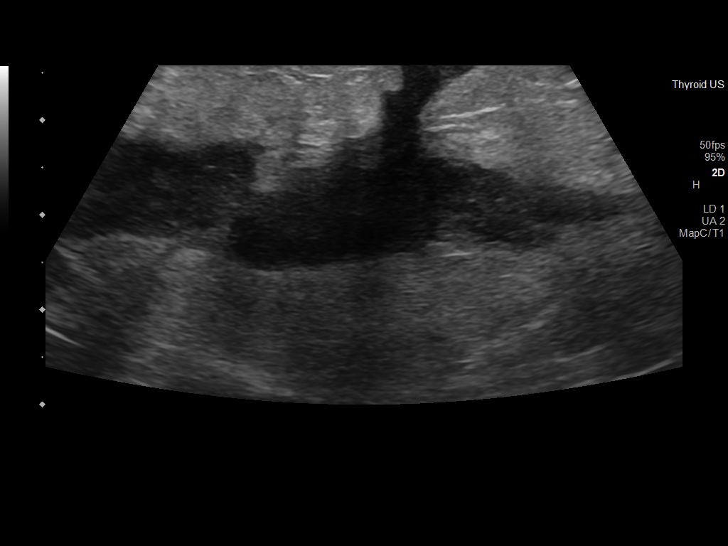
[im 13/17]
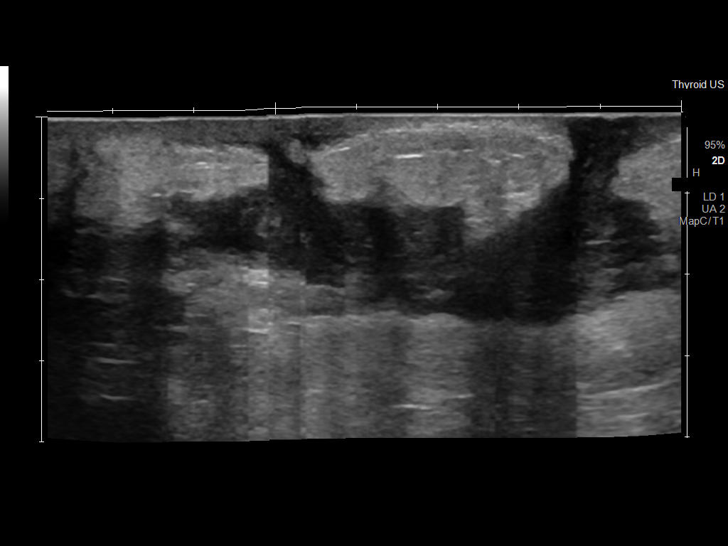
[im 14/17]
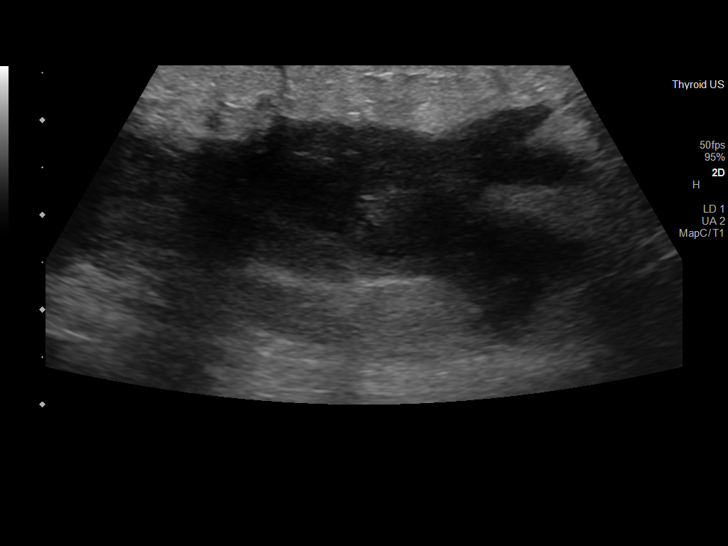
[im 16/17]
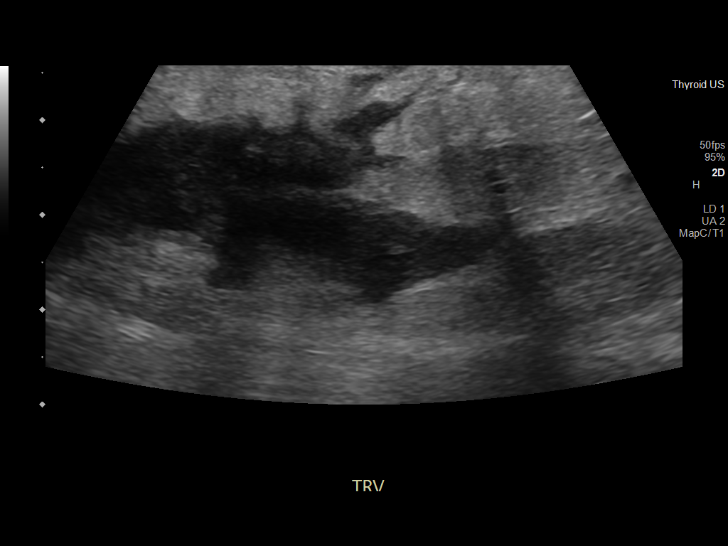
[im 17/17]
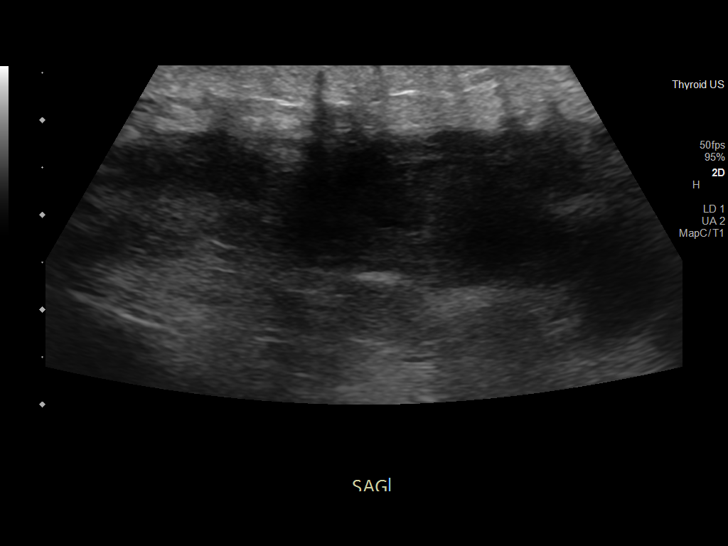

[14 of 17 positions shown; findings below may reference images not displayed]

FINDINGS: Irregular subcutaneous heterogeneously hypoechoic regions about the
right buttock area of concern. There are no organized fluid
collections or appropriate targets for fluid aspiration.
IMPRESSION: Irregularly shaped dense hematoma versus phlegmonous changes in the
right posterior buttocks. No fluid collections visualized as targets
for aspiration or drainage.

## 2022-12-06 ENCOUNTER — Ambulatory Visit: Payer: Medicare Other | Admitting: Psychiatry

## 2022-12-31 ENCOUNTER — Other Ambulatory Visit: Payer: Self-pay | Admitting: Psychiatry

## 2022-12-31 DIAGNOSIS — F331 Major depressive disorder, recurrent, moderate: Secondary | ICD-10-CM

## 2022-12-31 DIAGNOSIS — F411 Generalized anxiety disorder: Secondary | ICD-10-CM

## 2022-12-31 DIAGNOSIS — F4310 Post-traumatic stress disorder, unspecified: Secondary | ICD-10-CM

## 2023-01-24 ENCOUNTER — Other Ambulatory Visit: Payer: Self-pay | Admitting: Psychiatry

## 2023-01-24 ENCOUNTER — Ambulatory Visit (INDEPENDENT_AMBULATORY_CARE_PROVIDER_SITE_OTHER): Payer: Medicare Other | Admitting: Psychiatry

## 2023-01-24 ENCOUNTER — Encounter: Payer: Self-pay | Admitting: Psychiatry

## 2023-01-24 DIAGNOSIS — F331 Major depressive disorder, recurrent, moderate: Secondary | ICD-10-CM

## 2023-01-24 DIAGNOSIS — F5105 Insomnia due to other mental disorder: Secondary | ICD-10-CM

## 2023-01-24 DIAGNOSIS — F4310 Post-traumatic stress disorder, unspecified: Secondary | ICD-10-CM

## 2023-01-24 DIAGNOSIS — F411 Generalized anxiety disorder: Secondary | ICD-10-CM
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
## 2023-07-22 ENCOUNTER — Other Ambulatory Visit: Payer: Self-pay | Admitting: Psychiatry

## 2023-07-22 DIAGNOSIS — F4310 Post-traumatic stress disorder, unspecified: Secondary | ICD-10-CM

## 2023-07-22 DIAGNOSIS — F331 Major depressive disorder, recurrent, moderate: Secondary | ICD-10-CM

## 2023-07-22 DIAGNOSIS — F411 Generalized anxiety disorder: Secondary | ICD-10-CM

## 2023-07-25 ENCOUNTER — Ambulatory Visit: Payer: Medicare Other | Admitting: Psychiatry

## 2023-09-10 ENCOUNTER — Telehealth: Payer: Self-pay | Admitting: Psychiatry

## 2023-09-10 NOTE — Telephone Encounter (Signed)
 Stephanie Stokes called and LM at 10:41 requesting to be weaned off the Zoloft.  She wants to starting something like [employer] or something natural.  Appt 7/23.  Please call to discuss

## 2023-09-10 NOTE — Telephone Encounter (Signed)
 To prevent withdrawal and reduce risk of early relapse of anxiety she needs to wean sertraline slowly.  Reduce sertraline by 50 mg / month.  Move appt up to May since she is coming off to make sure she doesn't get worse.  St John's wort should not be combined with sertraline so don't start it until off the sertraline. Call if the anxiety gets worse or she gets off  the sertraline.

## 2023-09-10 NOTE — Telephone Encounter (Signed)
 Pt is only on sertraline, is seen yearly. She is asking to wean off the sertraline. She is concerned about Medicare and being able to afford her medications. I told her she could get thru [facility] for less than $20/mo. She said she is on multiple medications and doesn't know what the cost of other medications might be.

## 2023-09-11 NOTE — Telephone Encounter (Signed)
 Dr. Jennelle Human asked that FU be moved up to May.

## 2023-09-12 NOTE — Telephone Encounter (Signed)
 Please see message from patient. She is not wanting to wean off the sertraline now, said she is trying to be proactive in the event that things change with Medicare.

## 2023-09-12 NOTE — Telephone Encounter (Signed)
 I called Stephanie Stokes today to move up her appointment due to medication changes she wants to make and she said the medications are fine as they are.  She has not made any changes at this time. She only requested the change due to concerns about what the government is going to do with medicare and coverages.  She is trying to be proactive. After talking she is not going to make any changes.  It is important that she take care of health today and not on what may happen later.  She did not move her appt up.

## 2023-09-12 NOTE — Telephone Encounter (Signed)
 I'm glad she decided not to change sertraline.  There are no changes the govt would make that would affect her ability to continue sertraline.  It is cheap and generic.

## 2023-11-05 ENCOUNTER — Ambulatory Visit: Admitting: Psychiatry

## 2024-01-23 ENCOUNTER — Telehealth: Payer: Medicare Other | Admitting: Psychiatry

## 2024-01-31 ENCOUNTER — Telehealth: Payer: Self-pay | Admitting: Psychiatry

## 2024-01-31 DIAGNOSIS — F4310 Post-traumatic stress disorder, unspecified: Secondary | ICD-10-CM

## 2024-01-31 DIAGNOSIS — F331 Major depressive disorder, recurrent, moderate: Secondary | ICD-10-CM

## 2024-01-31 DIAGNOSIS — F411 Generalized anxiety disorder: Secondary | ICD-10-CM

## 2024-01-31 MED ORDER — SERTRALINE HCL 100 MG PO TABS: 200.0000 mg | ORAL_TABLET | Freq: Every day | ORAL | 0 refills | Status: DC

## 2024-01-31 NOTE — Telephone Encounter (Signed)
 Patient lvm requesting a refill on the Sertraline . [facility] stated per [facility] she is out of refills. Please fill at [id number], [place] - [address]  [place] [address] Phone: [phone]  Fax: [phone]   Appointment 04/17/24

## 2024-01-31 NOTE — Telephone Encounter (Signed)
 Sent!

## 2024-04-17 ENCOUNTER — Encounter: Payer: Self-pay | Admitting: Psychiatry

## 2024-04-17 ENCOUNTER — Ambulatory Visit: Admitting: Psychiatry

## 2024-04-17 DIAGNOSIS — F411 Generalized anxiety disorder: Secondary | ICD-10-CM

## 2024-04-17 DIAGNOSIS — F331 Major depressive disorder, recurrent, moderate: Secondary | ICD-10-CM | POA: Diagnosis not present

## 2024-04-17 DIAGNOSIS — F5105 Insomnia due to other mental disorder: Secondary | ICD-10-CM | POA: Diagnosis not present

## 2024-04-17 DIAGNOSIS — F4310 Post-traumatic stress disorder, unspecified: Secondary | ICD-10-CM

## 2024-04-17 MED ORDER — SERTRALINE HCL 100 MG PO TABS: 200.0000 mg | ORAL_TABLET | Freq: Every day | ORAL | 3 refills | Status: AC
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
## 2025-04-20 ENCOUNTER — Ambulatory Visit: Admitting: Psychiatry
